# Patient Record
Sex: Male | Born: 1975 | Race: White | Hispanic: No | Marital: Married | Smoking: Never smoker
Health system: Southern US, Community
[De-identification: ages and names within clinical notes are randomized; demographics above are authoritative.]

## PROBLEM LIST (undated history)

## (undated) DIAGNOSIS — Z8619 Personal history of other infectious and parasitic diseases: Secondary | ICD-10-CM

## (undated) DIAGNOSIS — C4491 Basal cell carcinoma of skin, unspecified: Secondary | ICD-10-CM

## (undated) DIAGNOSIS — K429 Umbilical hernia without obstruction or gangrene: Secondary | ICD-10-CM

## (undated) DIAGNOSIS — Z8781 Personal history of (healed) traumatic fracture: Secondary | ICD-10-CM

## (undated) HISTORY — PX: HERNIA REPAIR: SHX51

## (undated) HISTORY — PX: BASAL CELL CARCINOMA EXCISION: SHX1214

## (undated) HISTORY — PX: NOSE SURGERY: SHX723

---

## 2007-05-01 ENCOUNTER — Ambulatory Visit: Payer: Self-pay | Admitting: Family Medicine

## 2013-11-01 LAB — BASIC METABOLIC PANEL
BUN: 14 mg/dL (ref 4–21)
CREATININE: 1 mg/dL (ref 0.6–1.3)
Glucose: 98 mg/dL
POTASSIUM: 4.7 mmol/L (ref 3.4–5.3)
SODIUM: 140 mmol/L (ref 137–147)

## 2013-11-01 LAB — HEPATIC FUNCTION PANEL
ALK PHOS: 73 U/L (ref 25–125)
ALT: 15 U/L (ref 10–40)
AST: 22 U/L (ref 14–40)
Bilirubin, Total: 0.4 mg/dL

## 2013-11-01 LAB — CBC AND DIFFERENTIAL
HCT: 46 % (ref 41–53)
HEMOGLOBIN: 15.7 g/dL (ref 13.5–17.5)
Neutrophils Absolute: 6 /uL
Platelets: 171 10*3/uL (ref 150–399)
WBC: 8.9 10^3/mL

## 2013-11-01 LAB — TSH: TSH: 2 u[IU]/mL (ref 0.41–5.90)

## 2015-09-17 DIAGNOSIS — Z8619 Personal history of other infectious and parasitic diseases: Secondary | ICD-10-CM | POA: Insufficient documentation

## 2016-05-07 ENCOUNTER — Encounter: Payer: Self-pay | Admitting: Family Medicine

## 2016-11-30 DIAGNOSIS — Z85828 Personal history of other malignant neoplasm of skin: Secondary | ICD-10-CM | POA: Diagnosis not present

## 2016-11-30 DIAGNOSIS — D485 Neoplasm of uncertain behavior of skin: Secondary | ICD-10-CM | POA: Diagnosis not present

## 2016-11-30 DIAGNOSIS — C4441 Basal cell carcinoma of skin of scalp and neck: Secondary | ICD-10-CM | POA: Diagnosis not present

## 2017-01-04 ENCOUNTER — Encounter: Payer: Self-pay | Admitting: Family Medicine

## 2017-01-04 ENCOUNTER — Ambulatory Visit (INDEPENDENT_AMBULATORY_CARE_PROVIDER_SITE_OTHER): Payer: 59 | Admitting: Family Medicine

## 2017-01-04 VITALS — BP 118/72 | Temp 97.9°F | Resp 16 | Ht 71.0 in | Wt 183.0 lb

## 2017-01-04 DIAGNOSIS — Z23 Encounter for immunization: Secondary | ICD-10-CM

## 2017-01-04 DIAGNOSIS — Z0000 Encounter for general adult medical examination without abnormal findings: Secondary | ICD-10-CM | POA: Diagnosis not present

## 2017-01-04 NOTE — Progress Notes (Addendum)
Patient: Frank Gray, Male    DOB: 03/28/1976, 41 y.o.   MRN: [id number] Visit Date: 01/04/2017  Today's Provider: Lelon Huh, MD   Chief Complaint  Patient presents with  . Annual Exam   Subjective:   Patient presents today to re-establish Care. Was last seen for CPE in August 2015. Reviewed entire medical, social, and family history today.     Annual physical exam Frank Gray is a 41 y.o. male who presents today for health maintenance and complete physical. He feels well. He reports exercising every other day. He reports he is sleeping well.  Tdap- 04/23/2010.   Review of Systems  Constitutional: Negative.   HENT: Negative.   Eyes: Negative.   Respiratory: Negative.   Cardiovascular: Negative.   Gastrointestinal: Negative.   Endocrine: Negative.   Genitourinary: Negative.   Musculoskeletal: Negative.   Skin: Negative.   Allergic/Immunologic: Negative.   Neurological: Negative.   Hematological: Negative.   Psychiatric/Behavioral: Negative.     Social History      He  reports that he has never smoked. He has never used smokeless tobacco. He reports that he does not use drugs.       Social History   Social History  . Marital status: Married    Spouse name: N/A  . Number of children: 4  . Years of education: N/A   Social History Main Topics  . Smoking status: Never Smoker  . Smokeless tobacco: Never Used  . Alcohol use 3.0 - 3.6 oz/week    5 - 6 Cans of beer per week  . Drug use: No  . Sexual activity: Not Asked   Other Topics Concern  . None   Social History Narrative  . None    No past medical history on file.   Patient Active Problem List   Diagnosis Date Noted  . History of chicken pox 09/17/2015    Past Surgical History:  Procedure Laterality Date  . BASAL CELL CARCINOMA EXCISION      Family History        Family Status  Relation Status  . Mother Alive  . Father Alive  . Brother Alive        His family  history is not on file.     No Known Allergies  No current outpatient prescriptions on file.   Patient Care Team: Birdie Sons, MD as PCP - General (Family Medicine)      Objective:   Vitals: BP 118/72 (BP Location: Left Arm, Patient Position: Sitting, Cuff Size: Normal)   Temp 97.9 F (36.6 C)   Resp 16   Ht 5\' 11"  (1.803 m)   Wt 183 lb (83 kg)   BMI 25.52 kg/m    Vitals:   01/04/17 0919  BP: 118/72  Resp: 16  Temp: 97.9 F (36.6 C)  Weight: 183 lb (83 kg)  Height: 5\' 11"  (1.803 m)     Physical Exam   General Appearance:    Alert, cooperative, no distress, appears stated age  Head:    Normocephalic, without obvious abnormality, atraumatic  Eyes:    PERRL, conjunctiva/corneas clear, EOM's intact, fundi    benign, both eyes       Ears:    Normal TM's and external ear canals, both ears  Nose:   Nares normal, septum midline, mucosa normal, no drainage   or sinus tenderness  Throat:   Lips, mucosa, and tongue normal; teeth and gums normal  Neck:   Supple, symmetrical, trachea midline, no adenopathy;       thyroid:  No enlargement/tenderness/nodules; no carotid   bruit or JVD  Back:     Symmetric, no curvature, ROM normal, no CVA tenderness  Lungs:     Clear to auscultation bilaterally, respirations unlabored  Chest wall:    No tenderness or deformity  Heart:    Regular rate and rhythm, S1 and S2 normal, no murmur, rub   or gallop  Abdomen:     Soft, non-tender, bowel sounds active all four quadrants,    no masses, no organomegaly. Small umbilical hernia noted  Genitalia:    deferred  Rectal:    deferred  Extremities:   Extremities normal, atraumatic, no cyanosis or edema  Pulses:   2+ and symmetric all extremities  Skin:   Skin color, texture, turgor normal, no rashes or lesions  Lymph nodes:   Cervical, supraclavicular, and axillary nodes normal  Neurologic:   CNII-XII intact. Normal strength, sensation and reflexes      throughout    Depression  Screen PHQ 2/9 Scores 01/04/2017  PHQ - 2 Score 0      Assessment & Plan:     Routine Health Maintenance and Physical Exam  Exercise Activities and Dietary recommendations Goals    None      Immunization History  Administered Date(s) Administered  . Tdap 04/23/2010    Health Maintenance  Topic Date Due  . INFLUENZA VACCINE  11/03/2016  . HIV Screening  01/04/2018 (Originally 07/22/1990)  . TETANUS/TDAP  04/23/2020     Discussed health benefits of physical activity, and encouraged him to engage in regular exercise appropriate for his age and condition.    1. Annual physical exam Generally doing well. No complaints today. Normal exam. Healthy lifestyle habits.  - COMPLETE METABOLIC PANEL WITH GFR - Lipid panel  2. Influenza vaccine needed  - Flu Vaccine QUAD 6+ mos PF IM (Fluarix Quad PF)     Lelon Huh, MD  [facility]

## 2017-01-06 LAB — COMPLETE METABOLIC PANEL WITH GFR
AG RATIO: 1.6 (calc) (ref 1.0–2.5)
ALBUMIN MSPROF: 4.2 g/dL (ref 3.6–5.1)
ALT: 14 U/L (ref 9–46)
AST: 18 U/L (ref 10–40)
Alkaline phosphatase (APISO): 78 U/L (ref 40–115)
BILIRUBIN TOTAL: 0.4 mg/dL (ref 0.2–1.2)
BUN: 14 mg/dL (ref 7–25)
CHLORIDE: 104 mmol/L (ref 98–110)
CO2: 30 mmol/L (ref 20–32)
Calcium: 9 mg/dL (ref 8.6–10.3)
Creat: 1.08 mg/dL (ref 0.60–1.35)
GFR, EST AFRICAN AMERICAN: 98 mL/min/{1.73_m2} (ref >=60)
GFR, Est Non African American: 85 mL/min/{1.73_m2} (ref >=60)
GLOBULIN: 2.7 g/dL (ref 1.9–3.7)
Glucose, Bld: 110 mg/dL — ABNORMAL HIGH (ref 65–99)
POTASSIUM: 4.4 mmol/L (ref 3.5–5.3)
Sodium: 140 mmol/L (ref 135–146)
TOTAL PROTEIN: 6.9 g/dL (ref 6.1–8.1)

## 2017-01-06 LAB — LIPID PANEL
Cholesterol: 147 mg/dL (ref <200)
HDL: 58 mg/dL (ref >40)
LDL Cholesterol (Calc): 75 mg/dL (calc)
NON-HDL CHOLESTEROL (CALC): 89 mg/dL (ref <130)
Total CHOL/HDL Ratio: 2.5 (calc) (ref <5.0)
Triglycerides: 49 mg/dL (ref <150)

## 2017-02-28 DIAGNOSIS — C4441 Basal cell carcinoma of skin of scalp and neck: Secondary | ICD-10-CM | POA: Diagnosis not present

## 2017-02-28 DIAGNOSIS — L905 Scar conditions and fibrosis of skin: Secondary | ICD-10-CM | POA: Diagnosis not present

## 2017-05-30 DIAGNOSIS — Z85828 Personal history of other malignant neoplasm of skin: Secondary | ICD-10-CM | POA: Diagnosis not present

## 2017-05-30 DIAGNOSIS — L905 Scar conditions and fibrosis of skin: Secondary | ICD-10-CM | POA: Diagnosis not present

## 2017-05-30 DIAGNOSIS — D485 Neoplasm of uncertain behavior of skin: Secondary | ICD-10-CM | POA: Diagnosis not present

## 2017-05-30 DIAGNOSIS — C44319 Basal cell carcinoma of skin of other parts of face: Secondary | ICD-10-CM | POA: Diagnosis not present

## 2017-07-06 DIAGNOSIS — L988 Other specified disorders of the skin and subcutaneous tissue: Secondary | ICD-10-CM | POA: Diagnosis not present

## 2017-07-06 DIAGNOSIS — L578 Other skin changes due to chronic exposure to nonionizing radiation: Secondary | ICD-10-CM | POA: Diagnosis not present

## 2017-07-06 DIAGNOSIS — C44319 Basal cell carcinoma of skin of other parts of face: Secondary | ICD-10-CM | POA: Diagnosis not present

## 2018-02-21 DIAGNOSIS — D2261 Melanocytic nevi of right upper limb, including shoulder: Secondary | ICD-10-CM | POA: Diagnosis not present

## 2018-02-21 DIAGNOSIS — D2262 Melanocytic nevi of left upper limb, including shoulder: Secondary | ICD-10-CM | POA: Diagnosis not present

## 2018-02-21 DIAGNOSIS — Z85828 Personal history of other malignant neoplasm of skin: Secondary | ICD-10-CM | POA: Diagnosis not present

## 2018-02-27 DIAGNOSIS — Z23 Encounter for immunization: Secondary | ICD-10-CM | POA: Diagnosis not present

## 2018-04-17 ENCOUNTER — Ambulatory Visit
Admission: RE | Admit: 2018-04-17 | Discharge: 2018-04-17 | Disposition: A | Discharge status: Home / Self Care | Payer: 59 | Source: Ambulatory Visit | Attending: Family Medicine | Admitting: Family Medicine

## 2018-04-17 ENCOUNTER — Telehealth: Payer: Self-pay | Admitting: Family Medicine

## 2018-04-17 ENCOUNTER — Ambulatory Visit: Payer: 59 | Admitting: Family Medicine

## 2018-04-17 ENCOUNTER — Ambulatory Visit
Admission: RE | Admit: 2018-04-17 | Discharge: 2018-04-17 | Disposition: A | Discharge status: Home / Self Care | Payer: 59 | Attending: Family Medicine | Admitting: Family Medicine

## 2018-04-17 ENCOUNTER — Encounter: Payer: Self-pay | Admitting: Family Medicine

## 2018-04-17 ENCOUNTER — Telehealth: Payer: Self-pay

## 2018-04-17 VITALS — BP 100/60 | HR 76 | Temp 97.7°F | Resp 16 | Wt 198.4 lb

## 2018-04-17 DIAGNOSIS — R0781 Pleurodynia: Secondary | ICD-10-CM | POA: Insufficient documentation

## 2018-04-17 DIAGNOSIS — M546 Pain in thoracic spine: Secondary | ICD-10-CM | POA: Insufficient documentation

## 2018-04-17 NOTE — Telephone Encounter (Signed)
Patient is returning a call to [facility]

## 2018-04-17 NOTE — Progress Notes (Signed)
Patient: Frank Gray Male    DOB: 30-Jun-1975   43 y.o.   MRN: [id number] Visit Date: 04/17/2018  Today's Provider: Vernie Murders, PA   Chief Complaint  Patient presents with  . Back Pain   Subjective:     Back Pain  This is a new problem. The current episode started in the past 7 days (Wednesday afternoon). The problem occurs constantly. The problem has been gradually worsening since onset. Pain location: Mid section of back. Quality: Dull and Throbbing. Radiates to: radiate to the front of his ribs. The pain is at a severity of 7/10. The pain is moderate. The pain is the same all the time. The symptoms are aggravated by position (movement). Pertinent negatives include no numbness, tingling or weakness. He has tried bed rest for the symptoms.  Patient reports that he has this persistent cough in the mornings for the past months.  History reviewed. No pertinent past medical history. Past Surgical History:  Procedure Laterality Date  . BASAL CELL CARCINOMA EXCISION     Family History  Problem Relation Age of Onset  . Dementia Mother    No Known Allergies  No current outpatient medications on file.  Review of Systems  Musculoskeletal: Positive for back pain.  Neurological: Negative for tingling, weakness and numbness.   Social History   Tobacco Use  . Smoking status: Never Smoker  . Smokeless tobacco: Never Used  Substance Use Topics  . Alcohol use: Yes    Alcohol/week: 5.0 - 6.0 standard drinks    Types: 5 - 6 Cans of beer per week     Objective:   BP 100/60 (BP Location: Right Arm, Patient Position: Sitting, Cuff Size: Large)   Pulse 76   Temp 97.7 F (36.5 C) (Oral)   Resp 16   Wt 198 lb 6.4 oz (90 kg)   SpO2 97%   BMI 27.67 kg/m  Vitals:   04/17/18 0830  BP: 100/60  Pulse: 76  Resp: 16  Temp: 97.7 F (36.5 C)  TempSrc: Oral  SpO2: 97%  Weight: 198 lb 6.4 oz (90 kg)   Physical Exam Constitutional:      General: He is not in acute  distress.    Appearance: He is well-developed.  HENT:     Head: Normocephalic and atraumatic.     Right Ear: Hearing and tympanic membrane normal.     Left Ear: Hearing and tympanic membrane normal.     Nose: Nose normal.     Mouth/Throat:     Pharynx: Oropharynx is clear.  Eyes:     General: Lids are normal. No scleral icterus.       Right eye: No discharge.        Left eye: No discharge.     Conjunctiva/sclera: Conjunctivae normal.  Cardiovascular:     Rate and Rhythm: Normal rate and regular rhythm.     Pulses: Normal pulses.     Heart sounds: Normal heart sounds.  Pulmonary:     Effort: Pulmonary effort is normal. No respiratory distress.     Breath sounds: Normal breath sounds. No stridor. No wheezing, rhonchi or rales.  Chest:     Chest wall: No tenderness.  Abdominal:     General: Bowel sounds are normal.     Palpations: Abdomen is soft.  Musculoskeletal: Normal range of motion.  Skin:    Findings: No lesion or rash.  Neurological:     Mental Status: He is alert  and oriented to person, place, and time.  Psychiatric:        Speech: Speech normal.        Behavior: Behavior normal.        Thought Content: Thought content normal.       Assessment & Plan    1. Rib pain Onset 04-12-18 and continues to be a dull pain with movement. At rest no pain. No known injury or significant congestion Has had some cough recently. May use Aleve BID and get labs with CXR to rule out pulmonary issues. - CBC with Differential/Platelet - Comprehensive metabolic panel - DG Chest 2 View  2. Bilateral thoracic back pain, unspecified chronicity Bilateral posterior thoracic pain to bend over without rash or tenderness to palpation. Onset with rib pains with movement. Will check CXR to rule out pneumonia or pleural effusions. Also, check labs. May use Aleve BID prn. Recheck pending reports. - CBC with Differential/Platelet - Comprehensive metabolic panel - DG Chest 2 View     Vernie Murders, [place]  [facility]

## 2018-04-17 NOTE — Telephone Encounter (Signed)
-----   Message from Margo Common, [place] sent at 04/17/2018 10:04 AM EST ----- Chest x-ray normal without signs of cardiopulmonary or skeletal disease. Awaiting final lab reports.

## 2018-04-17 NOTE — Telephone Encounter (Signed)
Please see other telephone encounter.

## 2018-04-17 NOTE — Telephone Encounter (Signed)
Patient advised as directed below. 

## 2018-04-17 NOTE — Telephone Encounter (Signed)
LMTCB

## 2018-04-18 ENCOUNTER — Telehealth: Payer: Self-pay

## 2018-04-18 LAB — CBC WITH DIFFERENTIAL/PLATELET
BASOS: 1 %
Basophils Absolute: 0 10*3/uL (ref 0.0–0.2)
EOS (ABSOLUTE): 0.1 10*3/uL (ref 0.0–0.4)
Eos: 1 %
Hematocrit: 46.4 % (ref 37.5–51.0)
Hemoglobin: 16.1 g/dL (ref 13.0–17.7)
IMMATURE GRANS (ABS): 0 10*3/uL (ref 0.0–0.1)
Immature Granulocytes: 0 %
Lymphocytes Absolute: 2.3 10*3/uL (ref 0.7–3.1)
Lymphs: 28 %
MCH: 30.1 pg (ref 26.6–33.0)
MCHC: 34.7 g/dL (ref 31.5–35.7)
MCV: 87 fL (ref 79–97)
MONOS ABS: 0.8 10*3/uL (ref 0.1–0.9)
Monocytes: 9 %
NEUTROS ABS: 5.1 10*3/uL (ref 1.4–7.0)
Neutrophils: 61 %
PLATELETS: 190 10*3/uL (ref 150–450)
RBC: 5.34 x10E6/uL (ref 4.14–5.80)
RDW: 11.9 % (ref 11.6–15.4)
WBC: 8.3 10*3/uL (ref 3.4–10.8)

## 2018-04-18 LAB — COMPREHENSIVE METABOLIC PANEL
ALBUMIN: 4.8 g/dL (ref 3.5–5.5)
ALK PHOS: 86 IU/L (ref 39–117)
ALT: 18 IU/L (ref 0–44)
AST: 21 IU/L (ref 0–40)
Albumin/Globulin Ratio: 1.9 (ref 1.2–2.2)
BUN/Creatinine Ratio: 11 (ref 9–20)
BUN: 11 mg/dL (ref 6–24)
Bilirubin Total: 0.4 mg/dL (ref 0.0–1.2)
CO2: 24 mmol/L (ref 20–29)
CREATININE: 1.02 mg/dL (ref 0.76–1.27)
Calcium: 9.3 mg/dL (ref 8.7–10.2)
Chloride: 102 mmol/L (ref 96–106)
GFR calc Af Amer: 104 mL/min/{1.73_m2} (ref >59)
GFR calc non Af Amer: 90 mL/min/{1.73_m2} (ref >59)
GLUCOSE: 93 mg/dL (ref 65–99)
Globulin, Total: 2.5 g/dL (ref 1.5–4.5)
Potassium: 4.4 mmol/L (ref 3.5–5.2)
Sodium: 143 mmol/L (ref 134–144)
Total Protein: 7.3 g/dL (ref 6.0–8.5)

## 2018-04-18 NOTE — Telephone Encounter (Signed)
-----   Message from Margo Common, [place] sent at 04/18/2018  1:51 PM EST ----- Normal x-ray and labs. No sign of infection. Suspect rib discomfort secondary to muscle strain/inflammation. Use Aleve BID and moist heat applications to clear this discomfort. Recheck if any worsening or congestion issues in the next 7-10 days.

## 2018-04-18 NOTE — Telephone Encounter (Signed)
Patient advised as directed below.  Thanks,  -Joseline 

## 2019-05-16 IMAGING — CR DG CHEST 2V
1 series · 3 of 3 positions shown · non-contrast
Comparison: Radiographs May 01, 2007.

CLINICAL DATA: Acute bilateral rib pain without known injury.

EXAM:
CHEST - 2 VIEW

[Series 1: dg chest 2 view · 0.14mm/px · 3 of 3 slices shown]
[im 1/3]
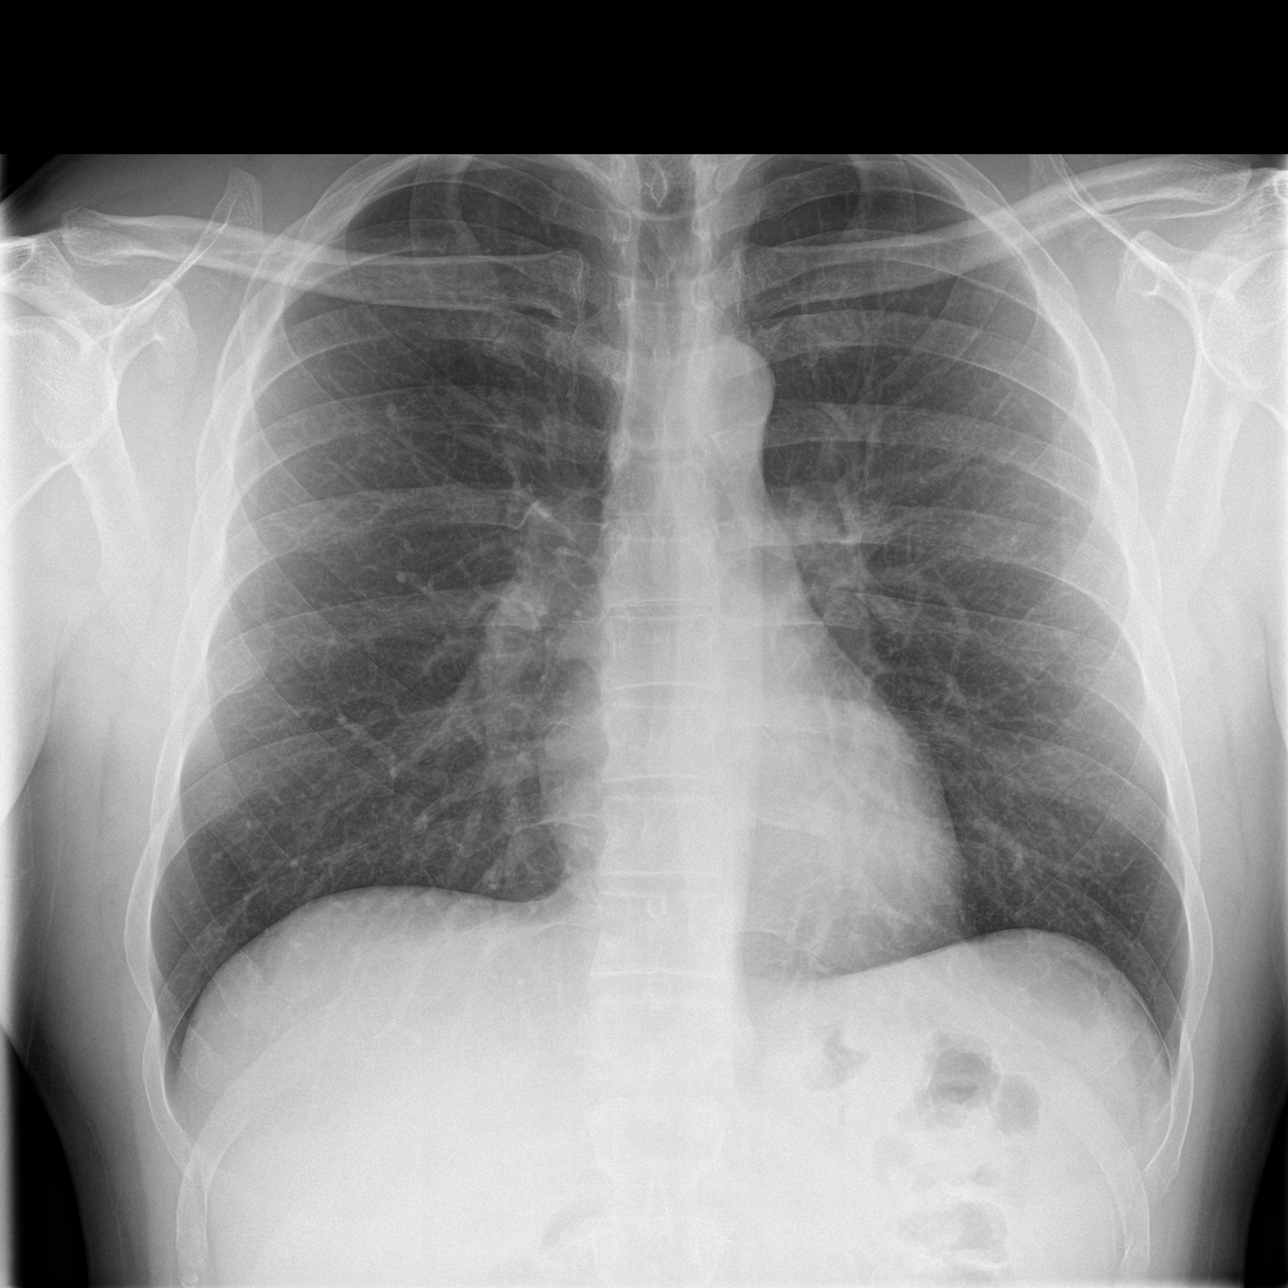
[im 2/3]
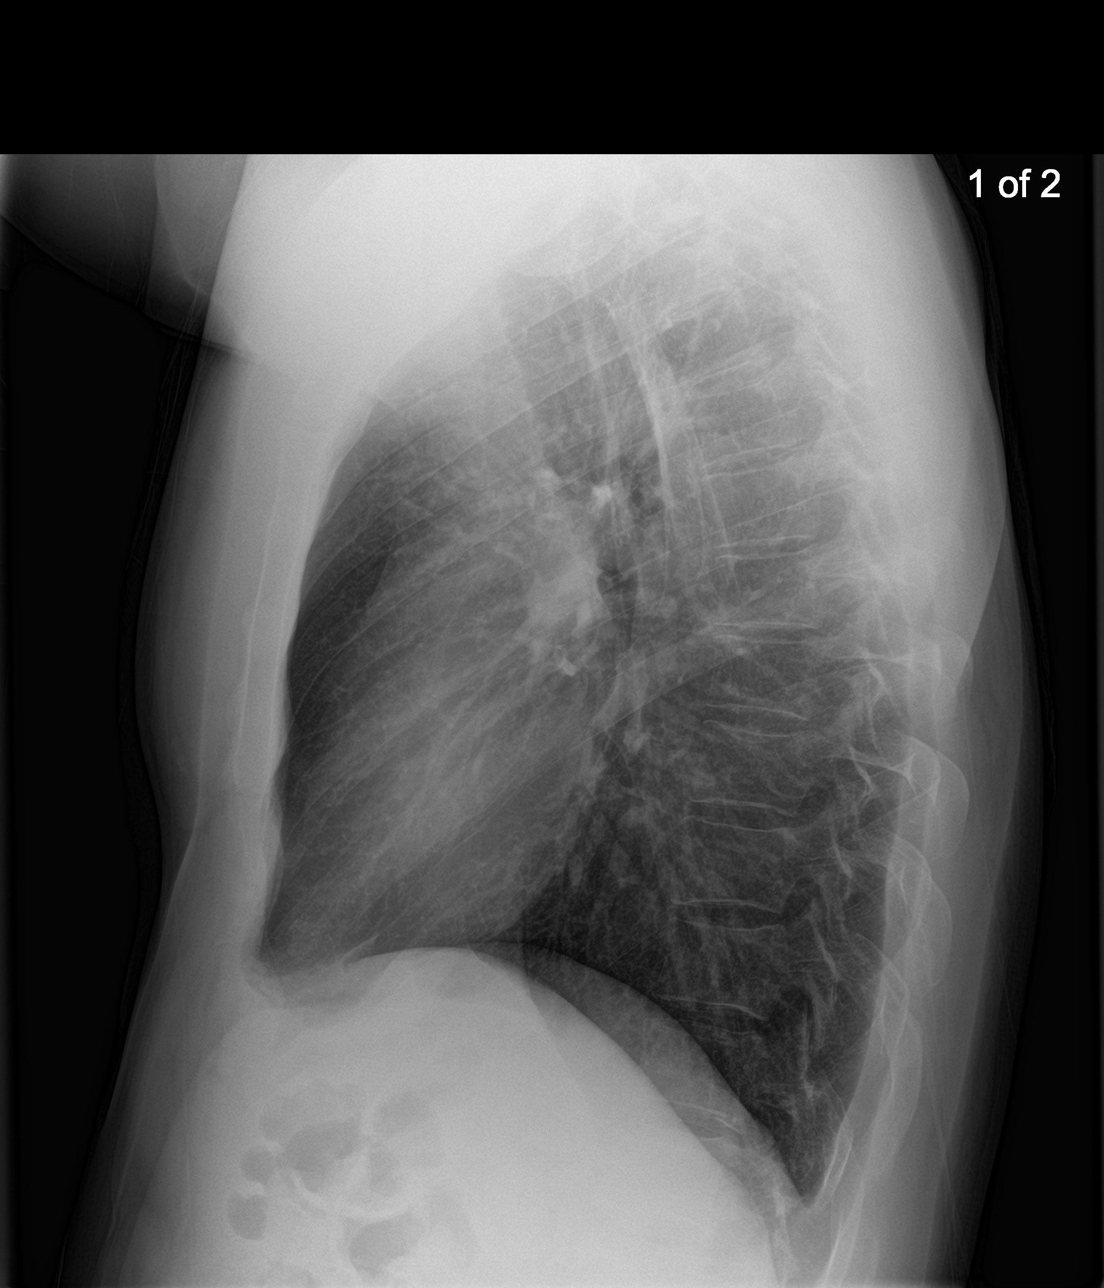
[im 3/3]
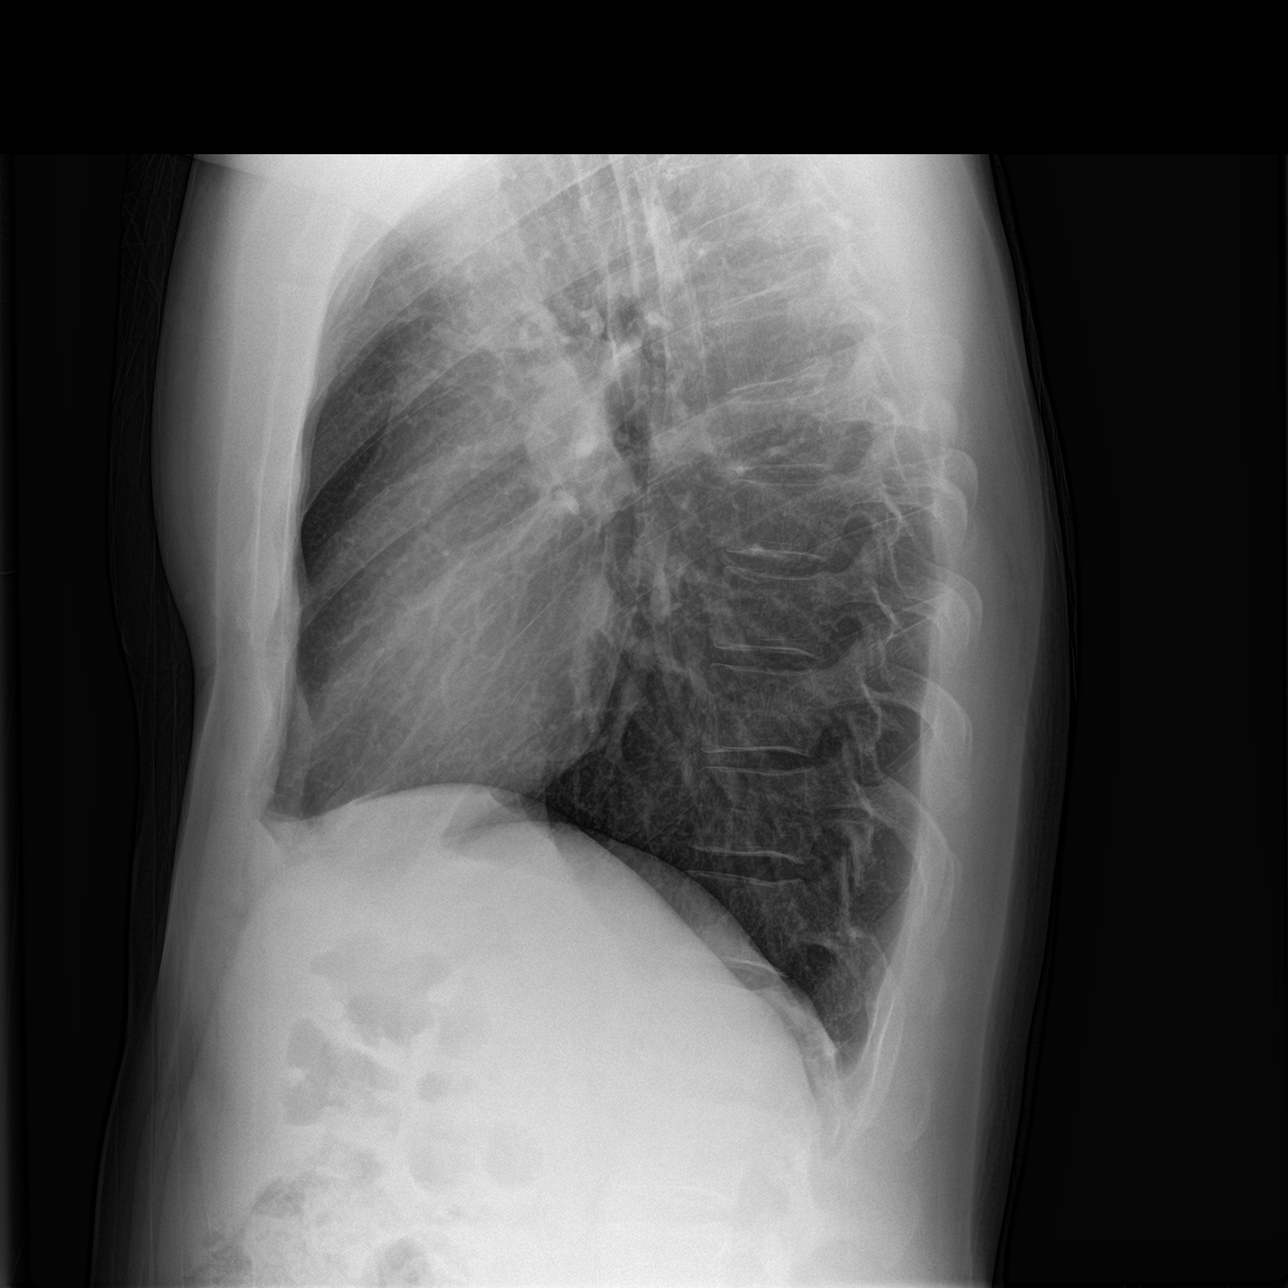

[3 of 3 positions shown; findings below may reference images not displayed]

FINDINGS: The heart size and mediastinal contours are within normal limits.
Both lungs are clear. The visualized skeletal structures are
unremarkable.
IMPRESSION: No active cardiopulmonary disease.

## 2020-05-20 ENCOUNTER — Other Ambulatory Visit: Payer: Self-pay

## 2020-05-20 ENCOUNTER — Encounter: Payer: Self-pay | Admitting: Family Medicine

## 2020-05-20 ENCOUNTER — Ambulatory Visit (INDEPENDENT_AMBULATORY_CARE_PROVIDER_SITE_OTHER): Payer: 59 | Admitting: Family Medicine

## 2020-05-20 VITALS — BP 115/76 | HR 74 | Temp 97.9°F | Resp 16 | Ht 71.5 in | Wt 198.0 lb

## 2020-05-20 DIAGNOSIS — K429 Umbilical hernia without obstruction or gangrene: Secondary | ICD-10-CM | POA: Insufficient documentation

## 2020-05-20 DIAGNOSIS — Z1159 Encounter for screening for other viral diseases: Secondary | ICD-10-CM

## 2020-05-20 DIAGNOSIS — Z0000 Encounter for general adult medical examination without abnormal findings: Secondary | ICD-10-CM | POA: Diagnosis not present

## 2020-05-20 DIAGNOSIS — Z23 Encounter for immunization: Secondary | ICD-10-CM | POA: Diagnosis not present

## 2020-05-20 NOTE — Patient Instructions (Addendum)
.   It is recommended to engage in 150 minutes of moderate exercise every week.     The current recommendation is to start colon cancer screening with a colonoscopy or Cologuard test at age 45. I suggest checking with insurance to see if this screening test is covered.

## 2020-05-20 NOTE — Progress Notes (Signed)
Complete physical exam   Patient: Frank Gray   DOB: Aug 22, 1975   45 y.o. Male  MRN: [id number] Visit Date: 05/20/2020  Today's healthcare provider: Lelon Huh, MD   Chief Complaint  Patient presents with  . Annual Exam   Subjective    Frank Gray is a 45 y.o. male who presents today for a complete physical exam.  He reports consuming a general diet. Gym/ health club routine includes cardio and weight lifting. He generally feels fairly well. He reports sleeping fairly well. He does not have additional problems to discuss today.  HPI    No past medical history on file. Past Surgical History:  Procedure Laterality Date  . BASAL CELL CARCINOMA EXCISION     Social History   Socioeconomic History  . Marital status: Married    Spouse name: Not on file  . Number of children: 4  . Years of education: Not on file  . Highest education level: Not on file  Occupational History  . Not on file  Tobacco Use  . Smoking status: Never Smoker  . Smokeless tobacco: Never Used  Substance and Sexual Activity  . Alcohol use: Yes    Alcohol/week: 5.0 - 6.0 standard drinks    Types: 5 - 6 Cans of beer per week  . Drug use: No  . Sexual activity: Not on file  Other Topics Concern  . Not on file  Social History Narrative  . Not on file   Social Determinants of Health   Financial Resource Strain: Not on file  Food Insecurity: Not on file  Transportation Needs: Not on file  Physical Activity: Not on file  Stress: Not on file  Social Connections: Not on file  Intimate Partner Violence: Not on file   Family Status  Relation Name Status  . Mother  Alive  . Father  Alive  . Brother  Alive   Family History  Problem Relation Age of Onset  . Dementia Mother    No Known Allergies  Patient Care Team: Birdie Sons, MD as PCP - General (Family Medicine)   Medications: No outpatient medications prior to visit.   No facility-administered medications prior to  visit.    Review of Systems  Constitutional: Negative for appetite change, chills, fatigue and fever.  HENT: Negative for congestion, ear pain, hearing loss, nosebleeds and trouble swallowing.   Eyes: Negative for pain and visual disturbance.  Respiratory: Negative for cough, chest tightness and shortness of breath.   Cardiovascular: Negative for chest pain, palpitations and leg swelling.  Gastrointestinal: Negative for abdominal pain, blood in stool, constipation, diarrhea, nausea and vomiting.  Endocrine: Negative for polydipsia, polyphagia and polyuria.  Genitourinary: Negative for dysuria and flank pain.  Musculoskeletal: Negative for arthralgias, back pain, joint swelling, myalgias and neck stiffness.  Skin: Negative for color change, rash and wound.  Neurological: Negative for dizziness, tremors, seizures, speech difficulty, weakness, light-headedness and headaches.  Psychiatric/Behavioral: Negative for behavioral problems, confusion, decreased concentration, dysphoric mood and sleep disturbance. The patient is not nervous/anxious.   All other systems reviewed and are negative.     Objective    BP 115/76 (BP Location: Left Arm, Patient Position: Sitting, Cuff Size: Large)   Pulse 74   Temp 97.9 F (36.6 C) (Temporal)   Resp 16   Ht 5' 11.5" (1.816 m)   Wt 198 lb (89.8 kg)   BMI 27.23 kg/m    Physical Exam   General Appearance:  Overweight male. Alert, cooperative, in no acute distress, appears stated age  Head:    Normocephalic, without obvious abnormality, atraumatic  Eyes:    PERRL, conjunctiva/corneas clear, EOM's intact, fundi    benign, both eyes       Ears:    Normal TM's and external ear canals, both ears  Neck:   Supple, symmetrical, trachea midline, no adenopathy;       thyroid:  No enlargement/tenderness/nodules; no carotid   bruit or JVD  Back:     Symmetric, no curvature, ROM normal, no CVA tenderness  Lungs:     Clear to auscultation bilaterally,  respirations unlabored  Chest wall:    No tenderness or deformity  Heart:    Normal heart rate. Normal rhythm. No murmurs, rubs, or gallops.  S1 and S2 normal  Abdomen:     Soft, non-tender, bowel sounds active all four quadrants,    no masses, no organomegaly. Small umbilical hernia.   Genitalia:    deferred  Rectal:    deferred  Extremities:   All extremities are intact. No cyanosis or edema  Pulses:   2+ and symmetric all extremities  Skin:   Skin color, texture, turgor normal, no rashes or lesions  Lymph nodes:   Cervical, supraclavicular, and axillary nodes normal  Neurologic:   CNII-XII intact. Normal strength, sensation and reflexes      throughout    Last depression screening scores PHQ 2/9 Scores 05/20/2020 04/17/2018 01/04/2017  PHQ - 2 Score 0 0 0  PHQ- 9 Score 0 - -   Last fall risk screening Fall Risk  05/20/2020  Falls in the past year? 0  Number falls in past yr: 0  Injury with Fall? 0  Follow up Falls evaluation completed   Last Audit-C alcohol use screening Alcohol Use Disorder Test (AUDIT) 05/20/2020  1. How often do you have a drink containing alcohol? 3  2. How many drinks containing alcohol do you have on a typical day when you are drinking? 0  3. How often do you have six or more drinks on one occasion? 1  AUDIT-C Score 4  4. How often during the last year have you found that you were not able to stop drinking once you had started? 0  5. How often during the last year have you failed to do what was normally expected from you because of drinking? 0  6. How often during the last year have you needed a first drink in the morning to get yourself going after a heavy drinking session? 0  7. How often during the last year have you had a feeling of guilt of remorse after drinking? 0  8. How often during the last year have you been unable to remember what happened the night before because you had been drinking? 0  9. Have you or someone else been injured as a result of  your drinking? 0  10. Has a relative or friend or a doctor or another health worker been concerned about your drinking or suggested you cut down? 0  Alcohol Use Disorder Identification Test Final Score (AUDIT) 4  Alcohol Brief Interventions/Follow-up AUDIT Score <7 follow-up not indicated   A score of 3 or more in women, and 4 or more in men indicates increased risk for alcohol abuse, EXCEPT if all of the points are from question 1   No results found for any visits on 05/20/20.  Assessment & Plan    Routine Health Maintenance and Physical  Exam  Exercise Activities and Dietary recommendations Goals   None     Immunization History  Administered Date(s) Administered  . Influenza,inj,Quad PF,6+ Mos 01/04/2017  . Tdap 04/23/2010    Health Maintenance  Topic Date Due  . Hepatitis C Screening  Never done  . COVID-19 Vaccine (1) Never done  . HIV Screening  Never done  . TETANUS/TDAP  04/23/2020  . INFLUENZA VACCINE  07/03/2020 (Originally 11/04/2019)    Discussed health benefits of physical activity, and encouraged him to engage in regular exercise appropriate for his age and condition.  1. Annual physical exam Doing well, counseled on ideal weight. Counseled on current recommendations for colon cancer screening. He declined flu vaccine today.  - CBC - Comprehensive metabolic panel - Lipid panel  2. Need for tetanus, diphtheria, and acellular pertussis (Tdap) vaccine in patient of adolescent age or older  - Administer Tetanus-diphtheria-acellular pertussis (Tdap) vaccine  3. Need for hepatitis C screening test  - Hepatitis C antibody   No follow-ups on file.     The entirety of the information documented in the History of Present Illness, Review of Systems and Physical Exam were personally obtained by me. Portions of this information were initially documented by the CMA and reviewed by me for thoroughness and accuracy.      Lelon Huh, MD  [facility] [phone] (phone) [phone] (fax)  [place]

## 2020-05-21 LAB — COMPREHENSIVE METABOLIC PANEL
ALT: 17 IU/L (ref 0–44)
AST: 19 IU/L (ref 0–40)
Albumin/Globulin Ratio: 1.7 (ref 1.2–2.2)
Albumin: 4.5 g/dL (ref 4.0–5.0)
Alkaline Phosphatase: 93 IU/L (ref 44–121)
BUN/Creatinine Ratio: 12 (ref 9–20)
BUN: 11 mg/dL (ref 6–24)
Bilirubin Total: 0.5 mg/dL (ref 0.0–1.2)
CO2: 25 mmol/L (ref 20–29)
Calcium: 9.7 mg/dL (ref 8.7–10.2)
Chloride: 100 mmol/L (ref 96–106)
Creatinine, Ser: 0.93 mg/dL (ref 0.76–1.27)
GFR calc Af Amer: 115 mL/min/{1.73_m2} (ref >59)
GFR calc non Af Amer: 99 mL/min/{1.73_m2} (ref >59)
Globulin, Total: 2.7 g/dL (ref 1.5–4.5)
Glucose: 107 mg/dL — ABNORMAL HIGH (ref 65–99)
Potassium: 4.3 mmol/L (ref 3.5–5.2)
Sodium: 140 mmol/L (ref 134–144)
Total Protein: 7.2 g/dL (ref 6.0–8.5)

## 2020-05-21 LAB — CBC
Hematocrit: 45.1 % (ref 37.5–51.0)
Hemoglobin: 15.3 g/dL (ref 13.0–17.7)
MCH: 29.8 pg (ref 26.6–33.0)
MCHC: 33.9 g/dL (ref 31.5–35.7)
MCV: 88 fL (ref 79–97)
Platelets: 201 10*3/uL (ref 150–450)
RBC: 5.14 x10E6/uL (ref 4.14–5.80)
RDW: 11.8 % (ref 11.6–15.4)
WBC: 8.9 10*3/uL (ref 3.4–10.8)

## 2020-05-21 LAB — LIPID PANEL
Chol/HDL Ratio: 3.1 ratio (ref 0.0–5.0)
Cholesterol, Total: 157 mg/dL (ref 100–199)
HDL: 50 mg/dL (ref >39)
LDL Chol Calc (NIH): 94 mg/dL (ref 0–99)
Triglycerides: 64 mg/dL (ref 0–149)
VLDL Cholesterol Cal: 13 mg/dL (ref 5–40)

## 2020-05-21 LAB — HEPATITIS C ANTIBODY: Hep C Virus Ab: <0.1 s/co ratio (ref 0.0–0.9)

## 2020-05-27 ENCOUNTER — Encounter: Payer: Self-pay | Admitting: Family Medicine

## 2020-05-27 ENCOUNTER — Telehealth (INDEPENDENT_AMBULATORY_CARE_PROVIDER_SITE_OTHER): Payer: 59 | Admitting: Family Medicine

## 2020-05-27 VITALS — Wt 193.0 lb

## 2020-05-27 DIAGNOSIS — J069 Acute upper respiratory infection, unspecified: Secondary | ICD-10-CM

## 2020-05-27 NOTE — Progress Notes (Signed)
MyChart Video Visit    Virtual Visit via Video Note   This visit type was conducted due to national recommendations for restrictions regarding the COVID-19 Pandemic (e.g. social distancing) in an effort to limit this patient's exposure and mitigate transmission in our community. This patient is at least at moderate risk for complications without adequate follow up. This format is felt to be most appropriate for this patient at this time. Physical exam was limited by quality of the video and audio technology used for the visit.    Patient location: home Provider location: [place] involved in the visit: patient, provider  I discussed the limitations of evaluation and management by telemedicine and the availability of in person appointments. The patient expressed understanding and agreed to proceed.  Patient: Frank Gray   DOB: [birth year]   45 y.o. Male  MRN: [id number] Visit Date: 05/27/2020  Today's healthcare provider: Lavon Paganini, MD   Chief Complaint  Patient presents with  . URI    Patient taking mucinex, reports mild symptom control.   Subjective    HPI HPI    URI    Associated symptoms inlclude congestion, cough, rhinorrhea, sinus pain and wheezing.  Recent episode started in the past 7 days.  The problem has been unchanged since onset.  The temperature has been with in normal range.  Patient  is drinking plenty of fluids.  Past hisotry is significant for  no history of pneumonia or bronchitis.  Patient is not a smoker. Additional comments: Patient taking mucinex, reports mild symptom control.       Last edited by Dorian Pod, [place] on 05/27/2020 11:23 AM. (History)      first day of symptoms 2/19 sinus congestion no headache, fever, sore throat covid vaccine x2 home test negative for COVID, had COVID 6 wks ago  Patient Active Problem List   Diagnosis Date Noted  . Umbilical hernia without obstruction and without  gangrene 05/20/2020  . History of chicken pox 09/17/2015   Social History   Tobacco Use  . Smoking status: Never Smoker  . Smokeless tobacco: Never Used  Substance Use Topics  . Alcohol use: Yes    Alcohol/week: 5.0 - 6.0 standard drinks    Types: 5 - 6 Cans of beer per week  . Drug use: No   No Known Allergies    Medications: No outpatient medications prior to visit.   No facility-administered medications prior to visit.    Review of Systems  Constitutional: Negative for chills, fatigue and fever.  HENT: Positive for congestion, rhinorrhea, sinus pressure and sinus pain. Negative for ear pain, postnasal drip and sore throat.   Respiratory: Positive for cough and wheezing. Negative for chest tightness and shortness of breath.   Cardiovascular: Negative for chest pain and palpitations.    Last CBC Lab Results  Component Value Date   WBC 8.9 05/20/2020   HGB 15.3 05/20/2020   HCT 45.1 05/20/2020   MCV 88 05/20/2020   MCH 29.8 05/20/2020   RDW 11.8 05/20/2020   PLT 201 05/20/2020      Objective    Wt 193 lb (87.5 kg)   BMI 26.54 kg/m  Wt Readings from Last 3 Encounters:  05/27/20 193 lb (87.5 kg)  05/20/20 198 lb (89.8 kg)  04/17/18 198 lb 6.4 oz (90 kg)      Physical Exam Constitutional:      General: He is not in acute distress.    Appearance: Normal  appearance.  Pulmonary:     Effort: Pulmonary effort is normal. No respiratory distress.  Neurological:     Mental Status: He is alert and oriented to person, place, and time.  Psychiatric:        Behavior: Behavior normal.        Assessment & Plan     1. Viral URI - symptoms and exam c/w viral URI - no evidence of strep pharyngitis, CAP, AOM, bacterial sinusitis, or other bacterial infection - unlikely COVID given recent infection and neg antigen test - would not repeat PCR as it could still be positive from recent infection - discussed symptomatic management (mucinex, honey, humidifier, etc),  natural course, and return precautions   Return if symptoms worsen or fail to improve.     I discussed the assessment and treatment plan with the patient. The patient was provided an opportunity to ask questions and all were answered. The patient agreed with the plan and demonstrated an understanding of the instructions.   The patient was advised to call back or seek an in-person evaluation if the symptoms worsen or if the condition fails to improve as anticipated.  I, Lavon Paganini, MD, have reviewed all documentation for this visit. The documentation on 05/27/20 for the exam, diagnosis, procedures, and orders are all accurate and complete.   Bacigalupo, Dionne Bucy, MD, MPH [facility]

## 2020-05-27 NOTE — Patient Instructions (Signed)

## 2020-06-05 ENCOUNTER — Telehealth (INDEPENDENT_AMBULATORY_CARE_PROVIDER_SITE_OTHER): Payer: 59 | Admitting: Physician Assistant

## 2020-06-05 DIAGNOSIS — J40 Bronchitis, not specified as acute or chronic: Secondary | ICD-10-CM

## 2020-06-05 DIAGNOSIS — R062 Wheezing: Secondary | ICD-10-CM | POA: Diagnosis not present

## 2020-06-05 DIAGNOSIS — J0110 Acute frontal sinusitis, unspecified: Secondary | ICD-10-CM

## 2020-06-05 MED ORDER — PREDNISONE 10 MG PO TABS: ORAL_TABLET | ORAL | 0 refills | Status: DC

## 2020-06-05 MED ORDER — AMOXICILLIN-POT CLAVULANATE 875-125 MG PO TABS: 1.0000 | ORAL_TABLET | Freq: Two times a day (BID) | ORAL | 0 refills | Status: AC

## 2020-06-05 NOTE — Patient Instructions (Signed)

## 2020-06-05 NOTE — Progress Notes (Signed)
MyChart Video Visit    Virtual Visit via Video Note   This visit type was conducted due to national recommendations for restrictions regarding the COVID-19 Pandemic (e.g. social distancing) in an effort to limit this patient's exposure and mitigate transmission in our community. This patient is at least at moderate risk for complications without adequate follow up. This format is felt to be most appropriate for this patient at this time. Physical exam was limited by quality of the video and audio technology used for the visit.   Patient location: Home Provider location: Office   I discussed the limitations of evaluation and management by telemedicine and the availability of in person appointments. The patient expressed understanding and agreed to proceed.  Patient: Frank Gray   DOB: 01-25-1976   45 y.o. Male  MRN: [id number] Visit Date: 06/05/2020  Today's healthcare provider: Trinna Post, PA-C   Chief Complaint  Patient presents with  . Cough  I,Porsha C McClurkin,acting as a scribe for Trinna Post, PA-C.,have documented all relevant documentation on the behalf of Trinna Post, PA-C,as directed by  Trinna Post, PA-C while in the presence of Trinna Post, PA-C.  Subjective    Cough This is a recurrent problem. The current episode started 1 to 4 weeks ago. The problem has been gradually worsening. The problem occurs constantly. The cough is productive of sputum. Associated symptoms include nasal congestion, shortness of breath and wheezing. Pertinent negatives include no chills, ear congestion, ear pain, headaches, postnasal drip, rhinorrhea or sore throat. Treatments tried: mucinex. The treatment provided mild relief. His past medical history is significant for bronchitis. There is no history of asthma, bronchiectasis, COPD, emphysema, environmental allergies or pneumonia.    Patient had URI several weeks ago with worsening nasal congestion since then.      Medications: No outpatient medications prior to visit.   No facility-administered medications prior to visit.    Review of Systems  Constitutional: Negative for chills.  HENT: Negative for ear pain, postnasal drip, rhinorrhea and sore throat.   Respiratory: Positive for cough, shortness of breath and wheezing.   Allergic/Immunologic: Negative for environmental allergies.  Neurological: Negative for headaches.      Objective    There were no vitals taken for this visit.   Physical Exam Constitutional:      Appearance: Normal appearance. He is obese.  Pulmonary:     Effort: Pulmonary effort is normal. No respiratory distress.  Skin:    General: Skin is warm and dry.  Neurological:     General: No focal deficit present.     Mental Status: He is alert and oriented to person, place, and time.  Psychiatric:        Mood and Affect: Mood normal.        Behavior: Behavior normal.        Assessment & Plan    1. Acute non-recurrent frontal sinusitis  - amoxicillin-clavulanate (AUGMENTIN) 875-125 MG tablet; Take 1 tablet by mouth 2 (two) times daily for 7 days.  Dispense: 14 tablet; Refill: 0  2. Wheezing  - predniSONE (DELTASONE) 10 MG tablet; Take 6 pills on day 1, 5 pills on day 2 and so on until complete.  Dispense: 21 tablet; Refill: 0  3. Bronchitis  - predniSONE (DELTASONE) 10 MG tablet; Take 6 pills on day 1, 5 pills on day 2 and so on until complete.  Dispense: 21 tablet; Refill: 0   No follow-ups on file.  I discussed the assessment and treatment plan with the patient. The patient was provided an opportunity to ask questions and all were answered. The patient agreed with the plan and demonstrated an understanding of the instructions.   The patient was advised to call back or seek an in-person evaluation if the symptoms worsen or if the condition fails to improve as anticipated.   ITrinna Post, PA-C, have reviewed all documentation for this  visit. The documentation on 06/05/20 for the exam, diagnosis, procedures, and orders are all accurate and complete.  The entirety of the information documented in the History of Present Illness, Review of Systems and Physical Exam were personally obtained by me. Portions of this information were initially documented by [facility] and reviewed by me for thoroughness and accuracy.    Paulene Floor [facility] [phone] (phone) [phone] (fax)  [place]

## 2020-10-28 ENCOUNTER — Other Ambulatory Visit: Payer: Self-pay

## 2020-10-28 ENCOUNTER — Encounter: Payer: Self-pay | Admitting: Family Medicine

## 2020-10-28 ENCOUNTER — Ambulatory Visit: Payer: 59 | Admitting: Family Medicine

## 2020-10-28 VITALS — BP 118/80 | HR 72 | Temp 98.2°F | Resp 16 | Wt 203.4 lb

## 2020-10-28 DIAGNOSIS — L723 Sebaceous cyst: Secondary | ICD-10-CM

## 2020-10-28 DIAGNOSIS — Z1211 Encounter for screening for malignant neoplasm of colon: Secondary | ICD-10-CM

## 2020-10-28 DIAGNOSIS — Z3009 Encounter for other general counseling and advice on contraception: Secondary | ICD-10-CM

## 2020-10-28 NOTE — Progress Notes (Signed)
I,Roshena L Chambers,acting as a scribe for Wilhemena Durie, MD.,have documented all relevant documentation on the behalf of Wilhemena Durie, MD,as directed by  Wilhemena Durie, MD while in the presence of Wilhemena Durie, MD.     Established patient visit   Patient: Frank Gray   DOB: [birth year]   45 y.o. Male  MRN: [id number] Visit Date: 10/28/2020  Today's healthcare provider: Wilhemena Durie, MD   Chief Complaint  Patient presents with   Mass   Subjective    HPI  Mass: Patient complains of a small mass on his pelvis. He first notice it 2-3 weeks ago and is unchaged in size. Patient denies any pain.  It is a size of a small pea.  No tenderness and no growth. No systemic symptoms. Patient is married and the father of 4 children ages 59 16,11 and 50.     Medications: Outpatient Medications Prior to Visit  Medication Sig   [DISCONTINUED] predniSONE (DELTASONE) 10 MG tablet Take 6 pills on day 1, 5 pills on day 2 and so on until complete. (Patient not taking: Reported on 10/28/2020)   No facility-administered medications prior to visit.    Review of Systems  Constitutional:  Negative for appetite change, chills and fever.  Respiratory:  Negative for chest tightness, shortness of breath and wheezing.   Cardiovascular:  Negative for chest pain and palpitations.  Gastrointestinal:  Negative for abdominal pain, nausea and vomiting.       Objective    BP 118/80 (BP Location: Left Arm, Patient Position: Sitting, Cuff Size: Large)   Pulse 72   Temp 98.2 F (36.8 C) (Temporal)   Resp 16   Wt 203 lb 6.4 oz (92.3 kg)   BMI 27.97 kg/m  BP Readings from Last 3 Encounters:  10/28/20 118/80  05/20/20 115/76  04/17/18 100/60   Wt Readings from Last 3 Encounters:  10/28/20 203 lb 6.4 oz (92.3 kg)  05/27/20 193 lb (87.5 kg)  05/20/20 198 lb (89.8 kg)       Physical Exam Vitals reviewed.  Constitutional:      Appearance: Normal appearance.   HENT:     Head: Normocephalic and atraumatic.     Right Ear: External ear normal.     Left Ear: External ear normal.  Eyes:     General: No scleral icterus.    Conjunctiva/sclera: Conjunctivae normal.  Cardiovascular:     Rate and Rhythm: Normal rate and regular rhythm.     Pulses: Normal pulses.     Heart sounds: Normal heart sounds.  Pulmonary:     Effort: Pulmonary effort is normal.     Breath sounds: Normal breath sounds.  Abdominal:     Palpations: Abdomen is soft.  Skin:    General: Skin is warm and dry.     Comments: There is a small sebaceous cyst in his lower abdomen that is not infected.  Neurological:     General: No focal deficit present.     Mental Status: He is alert and oriented to person, place, and time.  Psychiatric:        Mood and Affect: Mood normal.        Behavior: Behavior normal.        Thought Content: Thought content normal.        Judgment: Judgment normal.      No results found for any visits on 10/28/20.  Assessment & Plan  1. Sebaceous cyst No further work-up needed.  Patient advised to use hot compresses on the area if it grows or bothers  2. Encounter for vasectomy counseling Patient has four children ages 46, 59, 33 and 54. Patient does not want to have anymore children and expressed interest in getting a vasectomy. Will refer to Urology.  - Ambulatory referral to Urology  3. Colon cancer screening Refer to GI for screening colonoscopy at age 57 - Ambulatory referral to gastroenterology for colonoscopy   No follow-ups on file.      I, Wilhemena Durie, MD, have reviewed all documentation for this visit. The documentation on 11/13/20 for the exam, diagnosis, procedures, and orders are all accurate and complete.    Barkley Kratochvil Cranford Mon, MD  [facility] [phone] (phone) [phone] (fax)  [place]

## 2022-09-01 ENCOUNTER — Encounter: Payer: Self-pay | Admitting: Physician Assistant

## 2022-09-01 ENCOUNTER — Ambulatory Visit: Payer: 59 | Admitting: Physician Assistant

## 2022-09-01 VITALS — BP 121/85 | HR 68 | Temp 98.3°F | Resp 16 | Ht 71.0 in | Wt 204.4 lb

## 2022-09-01 DIAGNOSIS — J029 Acute pharyngitis, unspecified: Secondary | ICD-10-CM | POA: Diagnosis not present

## 2022-09-01 LAB — POCT RAPID STREP A (OFFICE): Rapid Strep A Screen: NEGATIVE

## 2022-09-01 MED ORDER — NYSTATIN 100000 UNIT/ML MT SUSP: 5.0000 mL | Freq: Four times a day (QID) | OROMUCOSAL | 1 refills | Status: DC | PRN

## 2022-09-01 MED ORDER — ALUM & MAG HYDROXIDE-SIMETH 200-200-20 MG/5ML PO SUSP
5.0000 mL | Freq: Four times a day (QID) | ORAL | 1 refills | Status: DC | PRN
Start: 2022-09-01 — End: 2022-11-12

## 2022-09-01 NOTE — Progress Notes (Signed)
Established patient visit  Patient: Frank Gray   DOB: 05/21/1975   47 y.o. Male  MRN: [id number] Visit Date: 09/01/2022  Today's healthcare provider: Debera Lat, PA-C   Chief Complaint  Patient presents with   Oral Swelling   Subjective    HPI  Throat Swelling: Reports swelling at the back of his throat x 2 days. Denies any fever, dyspnea or cold symptoms. Has tried using cough drops and sucking on ice with no relief   Admits having some sore throat Prior to the onset of swelling: No drug, no new food consumption No sick contact No fever No dental problems or URI, ear infection Denies ear pain Admits traveling down to the cost    10/28/2020    2:48 PM 05/20/2020    8:57 AM 04/17/2018    9:17 AM  Depression screen PHQ 2/9  Decreased Interest 0 0 0  Down, Depressed, Hopeless 0 0 0  PHQ - 2 Score 0 0 0  Altered sleeping 0 0   Tired, decreased energy 0 0   Change in appetite 0 0   Feeling bad or failure about yourself  0 0   Trouble concentrating 0 0   Moving slowly or fidgety/restless 0 0   Suicidal thoughts 0 0   PHQ-9 Score 0 0   Difficult doing work/chores Not difficult at all Not difficult at all     Medications: No outpatient medications prior to visit.   No facility-administered medications prior to visit.    Review of Systems  Constitutional:  Negative for appetite change, chills and fever.  Respiratory:  Negative for chest tightness, shortness of breath and wheezing.   Cardiovascular:  Negative for chest pain and palpitations.  Gastrointestinal:  Negative for abdominal pain, nausea and vomiting.   Except see HPI      Objective    BP 121/85   Pulse 68   Temp 98.3 F (36.8 C) (Oral)   Resp 16   Ht 5\' 11"  (1.803 m)   Wt 204 lb 6.4 oz (92.7 kg)   SpO2 98% Comment: room air  BMI 28.51 kg/m    Physical Exam Vitals reviewed.  Constitutional:      General: He is not in acute distress.    Appearance: Normal appearance. He is not  diaphoretic.  HENT:     Head: Normocephalic and atraumatic.  Eyes:     General: No scleral icterus.    Conjunctiva/sclera: Conjunctivae normal.  Cardiovascular:     Rate and Rhythm: Normal rate and regular rhythm.     Pulses: Normal pulses.     Heart sounds: Normal heart sounds. No murmur heard. Pulmonary:     Effort: Pulmonary effort is normal. No respiratory distress.     Breath sounds: Normal breath sounds. No wheezing or rhonchi.  Musculoskeletal:     Cervical back: Neck supple.     Right lower leg: No edema.     Left lower leg: No edema.  Lymphadenopathy:     Cervical: No cervical adenopathy.  Skin:    General: Skin is warm and dry.     Findings: No rash.  Neurological:     Mental Status: He is alert and oriented to person, place, and time. Mental status is at baseline.  Psychiatric:        Mood and Affect: Mood normal.        Behavior: Behavior normal.    Picture of the uvula swelling and white film covering on the tip of  uvula should be noted  No results found for any visits on 09/01/22.  Assessment & Plan    1. Sore throat With uvular swelling with white film covering it Could be allergy but pt denies breathing problem/anaphylaxis Advised to proceed with epipen, pt denied Could be yeast infection but pt denies having recent abx use or being immunocompromised in any way Could be infection but there are no other associated symptoms at this visit. Normal vitals  - POCT rapid strep A negative. - magic mouthwash (nystatin, lidocaine, diphenhydrAMINE, alum & mag hydroxide) suspension; Swish and spit 5 mLs 4 (four) times daily as needed for mouth pain.  Dispense: 180 mL; Refill: 1 - advised to call in antihistamines vs prednisone vs chlorhexidine solution, pt preffered to wait on it Will reassess in 2-3 days.  No follow-ups on file. Fu with Dr. Sherrie Mustache     The patient was advised to call back or seek an in-person evaluation if the symptoms worsen or if the condition  fails to improve as anticipated.  I discussed the assessment and treatment plan with the patient. The patient was provided an opportunity to ask questions and all were answered. The patient agreed with the plan and demonstrated an understanding of the instructions.  I, Debera Lat, PA-C have reviewed all documentation for this visit. The documentation on  5/2 9/69for the exam, diagnosis, procedures, and orders are all accurate and complete.  Debera Lat, [facility], MMS [facility] [phone] (phone) [phone] (fax)  [facility]

## 2022-11-08 ENCOUNTER — Telehealth: Payer: Self-pay

## 2022-11-08 ENCOUNTER — Ambulatory Visit: Payer: Self-pay

## 2022-11-08 ENCOUNTER — Encounter: Payer: Self-pay | Admitting: Family Medicine

## 2022-11-08 ENCOUNTER — Ambulatory Visit: Payer: 59 | Admitting: Family Medicine

## 2022-11-08 VITALS — BP 122/83 | HR 77 | Ht 71.0 in | Wt 204.9 lb

## 2022-11-08 DIAGNOSIS — K429 Umbilical hernia without obstruction or gangrene: Secondary | ICD-10-CM | POA: Diagnosis not present

## 2022-11-08 NOTE — Progress Notes (Signed)
      Established patient visit   Patient: Frank Gray   DOB: 1975/09/17   47 y.o. Male  MRN: [id number] Visit Date: 11/08/2022  Today's healthcare provider: Mila Merry, MD   Chief Complaint  Patient presents with   Abdominal Pain    Patient reports having umbilical hernia that is now beginning to cause pain. Reports pain being gradual and constant since this weekend and at a 6-7/10. Reports nothing relieving the pain and pain is associated with pressure. Patient reports he can push back in but it comes right back out.    Subjective    Discussed the use of AI scribe software for clinical note transcription with the patient, who gave verbal consent to proceed.  History of Present Illness   The patient, with a longstanding history of an umbilical hernia, reports a recent increase in discomfort and pressure in the area. He describes the hernia as having been present since childhood, but note a recent progression in its size and associated symptoms. The patient woke up with pain and pressure under the belly button the day before the consultation, which has persisted. Prior to this, he had noticed some swelling but minimal pain. The discomfort is described as being more pronounced on the back side of the hernia. The patient denies any tenderness to touch. He has not engaged in any unusually strenuous activity recently.       Medications: Outpatient Medications Prior to Visit  Medication Sig   magic mouthwash (nystatin, lidocaine, diphenhydrAMINE, alum & mag hydroxide) suspension Swish and spit 5 mLs 4 (four) times daily as needed for mouth pain. (Patient not taking: Reported on 11/08/2022)   No facility-administered medications prior to visit.        Objective    BP 122/83 (BP Location: Left Arm, Patient Position: Sitting, Cuff Size: Normal)   Pulse 77   Ht 5\' 11"  (1.803 m)   Wt 204 lb 14.4 oz (92.9 kg)   SpO2 99%   BMI 28.58 kg/m   Physical Exam   ABDOMEN: Umbilical  hernia present, no tenderness or erythema.    No results found for any visits on 11/08/22.  Assessment & Plan     Assessment and Plan    Umbilical Hernia Progressive swelling and recent onset of pain and pressure. No signs of infection on examination. -Refer to general surgery for evaluation and potential surgical repair. -Avoid strenuous activity and heavy lifting until surgical evaluation. -Contact patient if no communication from surgical team by end of the week.          Mila Merry, MD  [facility] [phone] (phone) [phone] (fax)  [facility]

## 2022-11-08 NOTE — Telephone Encounter (Signed)
Copied from CRM [id number]. Topic: Referral - Request for Referral >> Nov 08, 2022  8:18 AM Frank Gray wrote: Reason for CRM: umbilical hernia  Has patient seen PCP for this complaint? Yes.  (Pt states that he has had this for Gray while) *If NO, is insurance requiring patient see PCP for this issue before PCP can refer them? Referral for which specialty: Pt is unsure which speciality he need to be referred to. Preferred provider/office: Pt would like for PCP to do referral. Reason for referral: Pt states that he has had an umbilical hernia for Gray while, and he woke up yesterday in pain and is needing to know who to be referred to or what number to call.

## 2022-11-08 NOTE — Telephone Encounter (Signed)
  Pt states that he has had a umbilical hernia for a while and he woke up yesterday in pain and is having pain today. Please advise.     Chief Complaint: Abdominal pain at belly button. "Small protrusion." Symptoms: Above Frequency: This weekend Pertinent Negatives: Patient denies any other symptoms Disposition: [] ED /[] Urgent Care (no appt availability in office) / [x] Appointment(In office/virtual)/ []  [facility]/ [] Home Care/ [] Refused Recommended Disposition /[] [facility] [facility]/ []  Follow-up with PCP Additional Notes: Pt. Agrees with appointment.  Reason for Disposition  [1] MILD-MODERATE pain AND [2] constant AND [3] present > 2 hours  Answer Assessment - Initial Assessment Questions 1. LOCATION: "Where does it hurt?"      Belly button 2. RADIATION: "Does the pain shoot anywhere else?" (e.g., chest, back)     No 3. ONSET: "When did the pain begin?" (Minutes, hours or days ago)      This weekend 4. SUDDEN: "Gradual or sudden onset?"     Gradual 5. PATTERN "Does the pain come and go, or is it constant?"    - If it comes and goes: "How long does it last?" "Do you have pain now?"     (Note: Comes and goes means the pain is intermittent. It goes away completely between bouts.)    - If constant: "Is it getting better, staying the same, or getting worse?"      (Note: Constant means the pain never goes away completely; most serious pain is constant and gets worse.)      Constant 6. SEVERITY: "How bad is the pain?"  (e.g., Scale 1-10; mild, moderate, or severe)    - MILD (1-3): Doesn't interfere with normal activities, abdomen soft and not tender to touch.     - MODERATE (4-7): Interferes with normal activities or awakens from sleep, abdomen tender to touch.     - SEVERE (8-10): Excruciating pain, doubled over, unable to do any normal activities.       6-7 7. RECURRENT SYMPTOM: "Have you ever had this type of stomach pain before?" If Yes, ask: "When was the last time?"  and "What happened that time?"      Yes 8. CAUSE: "What do you think is causing the stomach pain?"     Hernia 9. RELIEVING/AGGRAVATING FACTORS: "What makes it better or worse?" (e.g., antacids, bending or twisting motion, bowel movement)     No 10. OTHER SYMPTOMS: "Do you have any other symptoms?" (e.g., back pain, diarrhea, fever, urination pain, vomiting)       Pressure  Protocols used: Abdominal Pain - Male-A-AH

## 2022-11-10 LAB — CBC AND DIFFERENTIAL
HCT: 43 (ref 41–53)
Hemoglobin: 14.8 (ref 13.5–17.5)
Neutrophils Absolute: 7.4
Platelets: 170 10*3/uL (ref 150–400)
WBC: 12.7

## 2022-11-10 LAB — TESTOSTERONE: Testosterone: 341

## 2022-11-10 LAB — PSA: PSA: 1.3

## 2022-11-10 LAB — CBC: RBC: 4.85 (ref 3.87–5.11)

## 2022-11-10 LAB — VITAMIN D 25 HYDROXY (VIT D DEFICIENCY, FRACTURES): Vit D, 25-Hydroxy: 119

## 2022-11-11 ENCOUNTER — Ambulatory Visit: Payer: 59 | Admitting: Surgery

## 2022-11-11 ENCOUNTER — Encounter: Payer: Self-pay | Admitting: Family Medicine

## 2022-11-12 ENCOUNTER — Telehealth (INDEPENDENT_AMBULATORY_CARE_PROVIDER_SITE_OTHER): Payer: 59 | Admitting: Family Medicine

## 2022-11-12 ENCOUNTER — Encounter: Payer: Self-pay | Admitting: Family Medicine

## 2022-11-12 DIAGNOSIS — D72829 Elevated white blood cell count, unspecified: Secondary | ICD-10-CM

## 2022-11-12 NOTE — Progress Notes (Signed)
MyChart Video Visit    Virtual Visit via Video Note   This format is felt to be most appropriate for this patient at this time. Physical exam was limited by quality of the video and audio technology used for the visit.   Patient location: home Provider location: [facility]  I discussed the limitations of evaluation and management by telemedicine and the availability of in person appointments. The patient expressed understanding and agreed to proceed.  Patient: Frank Gray   DOB: [birth year]   47 y.o. Male  MRN: [id number] Visit Date: 11/12/2022  Today's healthcare provider: Mila Merry, MD   Chief Complaint  Patient presents with   lab result   Subjective     History of Present Illness   The patient, who is currently on testosterone replacement therapy (BHRT) through [facility], presented with concerns about recent blood work results. The blood work, which is routinely done every six months as part of his BHRT, was performed at [facility] a few days prior. The patient expressed concern about an elevated white blood cell count, which was a deviation from his typically normal results. he is noted to have WBC of 12.7, and mildly elevate absolute neutrophils, lymphs, and monocytes, but normal differential percentages.   The patient also reported taking vitamin D supplements, which may have contributed to the high vitamin D levels of 119.0. He plans to reduce his intake of these supplements.  In addition to the blood work concerns, the patient mentioned an umbilical hernia issue. However, he did not feel unwell or experience any symptoms that could be associated with an infection or the elevated white blood cell count. He is not on any new medications and does not take over-the-counter medicines like Tylenol or Motrin.  The patient has been on testosterone for a couple of years as part of his BHRT, and the dosage has not changed recently.      Medications: Outpatient Medications Prior  to Visit  Medication Sig   [DISCONTINUED] magic mouthwash (nystatin, lidocaine, diphenhydrAMINE, alum & mag hydroxide) suspension Swish and spit 5 mLs 4 (four) times daily as needed for mouth pain. (Patient not taking: Reported on 11/08/2022)   No facility-administered medications prior to visit.    Review of Systems  Constitutional:  Negative for appetite change, chills and fever.  Respiratory:  Negative for chest tightness, shortness of breath and wheezing.   Cardiovascular:  Negative for chest pain and palpitations.  Gastrointestinal:  Negative for abdominal pain, nausea and vomiting.     Objective    There were no vitals taken for this visit.   Physical Exam  Awake, alert, oriented x 3. In no apparent distress       Assessment & Plan     Assessment and Plan    Elevated White Blood Cell Count Recent labs showed elevated WBC count. No recent illness or new medications reported. Patient is asymptomatic. Discussed potential causes including infection, stress, and malignancies. -Repeat CBC with differential next week to assess if elevation persists.  Vitamin D Supplementation Recent labs showed high Vitamin D levels. Patient is currently taking Vitamin D supplements. -Advise patient to reduce Vitamin D supplementation.  Umbilical Hernia Patient has a scheduled appointment with a surgeon for a hernia issue. No acute symptoms reported. -Continue with scheduled surgical consultation.       No follow-ups on file.     I discussed the assessment and treatment plan with the patient. The patient was provided an opportunity to ask questions  and all were answered. The patient agreed with the plan and demonstrated an understanding of the instructions.   The patient was advised to call back or seek an in-person evaluation if the symptoms worsen or if the condition fails to improve as anticipated.  I provided 9 minutes of non-face-to-face time during this encounter.    Mila Merry, MD [facility] [phone] (phone) [phone] (fax)  [facility]

## 2022-11-17 NOTE — Progress Notes (Unsigned)
Patient ID: Frank Gray, male   DOB: [birth year], 47 y.o.   MRN: [id number]  Chief Complaint: Umbilical hernia  History of Present Illness Frank Gray is a 47 y.o. male with umbilical hernia, pretty much present all his life.  Has gradually become bigger over time.  He has had approximately 2 weeks where there is been increasing pain and pressure in the area.  It has remained reducible, but it quickly recurs.  He has had no bowel habit changes, no difficulty voiding, no constipation and no persistent heavy work or heavy lifting that has provoked this.  He enjoys golfing and looks forward to getting back to this.  Past Medical History History reviewed. No pertinent past medical history.    Past Surgical History:  Procedure Laterality Date   BASAL CELL CARCINOMA EXCISION      No Known Allergies  No current outpatient medications on file.   No current facility-administered medications for this visit.    Family History Family History  Problem Relation Age of Onset   Dementia Mother    Cancer Neg Hx       Social History Social History   Tobacco Use   Smoking status: Never   Smokeless tobacco: Never  Substance Use Topics   Alcohol use: Yes    Alcohol/week: 5.0 - 6.0 standard drinks of alcohol    Types: 5 - 6 Cans of beer per week   Drug use: No        Review of Systems  Constitutional: Negative.   HENT: Negative.    Eyes: Negative.   Respiratory: Negative.    Cardiovascular: Negative.   Gastrointestinal: Negative.   Genitourinary: Negative.   Skin: Negative.   Neurological: Negative.   Psychiatric/Behavioral: Negative.       Physical Exam Blood pressure (!) 142/78, pulse 78, temperature 98.3 F (36.8 C), temperature source Oral, height 5\' 11"  (1.803 m), weight 202 lb 12.8 oz (92 kg), SpO2 98%. Last Weight  Most recent update: 11/18/2022  8:47 AM    Weight  92 kg (202 lb 12.8 oz)             CONSTITUTIONAL: Well developed, and nourished,  appropriately responsive and aware without distress.   EYES: Sclera non-icteric.   EARS, NOSE, MOUTH AND THROAT:  The oropharynx is clear. Oral mucosa is pink and moist.    Hearing is intact to voice.  NECK: Trachea is midline, and there is no jugular venous distension.  LYMPH NODES:  Lymph nodes in the neck are not appreciated. RESPIRATORY:  Lungs are clear, and breath sounds are equal bilaterally.  Normal respiratory effort without pathologic use of accessory muscles. CARDIOVASCULAR: Heart is regular in rate and rhythm.   Well perfused.  GI: The abdomen is notable for irregular bulging skin at the umbilicus, with a reducible soft tissue present to a fascial defect estimated at 1.5 to 2 cm, otherwise soft, nontender, and nondistended. There were no palpable masses.  I did not appreciate hepatosplenomegaly. MUSCULOSKELETAL:  Symmetrical muscle tone appreciated in all four extremities.    SKIN: Skin turgor is normal. No pathologic skin lesions appreciated.  NEUROLOGIC:  Motor and sensation appear grossly normal.  Cranial nerves are grossly without defect. PSYCH:  Alert and oriented to person, place and time. Affect is appropriate for situation.  Data Reviewed I have personally reviewed what is currently available of the patient's imaging, recent labs and medical records.   Labs:     Latest Ref Rng & Units  11/15/2022    8:27 AM 11/10/2022   12:00 AM 05/20/2020    9:34 AM  CBC  WBC 3.4 - 10.8 x10E3/uL 10.1  12.7     8.9   Hemoglobin 13.0 - 17.7 g/dL 86.5  78.4     69.6   Hematocrit 37.5 - 51.0 % 43.7  43     45.1   Platelets 150 - 450 x10E3/uL 235  170     201      This result is from an external source.      Latest Ref Rng & Units 05/20/2020    9:34 AM 04/17/2018    9:18 AM 01/06/2017    8:24 AM  CMP  Glucose 65 - 99 mg/dL 295  93  284   BUN 6 - 24 mg/dL 11  11  14    Creatinine 0.76 - 1.27 mg/dL 1.32  4.40  1.02   Sodium 134 - 144 mmol/L 140  143  140   Potassium 3.5 - 5.2 mmol/L 4.3   4.4  4.4   Chloride 96 - 106 mmol/L 100  102  104   CO2 20 - 29 mmol/L 25  24  30    Calcium 8.7 - 10.2 mg/dL 9.7  9.3  9.0   Total Protein 6.0 - 8.5 g/dL 7.2  7.3  6.9   Total Bilirubin 0.0 - 1.2 mg/dL 0.5  0.4  0.4   Alkaline Phos 44 - 121 IU/L 93  86    AST 0 - 40 IU/L 19  21  18    ALT 0 - 44 IU/L 17  18  14        Imaging: Radiological images reviewed:   Within last 24 hrs: No results found.  Assessment    Umbilical hernia, nonrecurrent, reducible, estimated fascial size less than 2 cm. Patient Active Problem List   Diagnosis Date Noted   Umbilical hernia without obstruction and without gangrene 05/20/2020   History of chicken pox 09/17/2015    Plan    Open umbilical hernia repair, initial, reducible, estimated fascial defect less than 3 cm.   Face-to-face time spent with the patient and accompanying care providers(if present) was 25 minutes, with more than 50% of the time spent counseling, educating, and coordinating care of the patient.    These notes generated with voice recognition software. I apologize for typographical errors.  Campbell Lerner M.D., FACS 11/18/2022, 9:09 AM

## 2022-11-17 NOTE — H&P (View-Only) (Signed)
 Patient ID: Frank Gray, male   DOB: [birth year], 47 y.o.   MRN: [id number]  Chief Complaint: Umbilical hernia  History of Present Illness Frank Gray is a 47 y.o. male with umbilical hernia, pretty much present all his life.  Has gradually become bigger over time.  He has had approximately 2 weeks where there is been increasing pain and pressure in the area.  It has remained reducible, but it quickly recurs.  He has had no bowel habit changes, no difficulty voiding, no constipation and no persistent heavy work or heavy lifting that has provoked this.  He enjoys golfing and looks forward to getting back to this.  Past Medical History History reviewed. No pertinent past medical history.    Past Surgical History:  Procedure Laterality Date   BASAL CELL CARCINOMA EXCISION      No Known Allergies  No current outpatient medications on file.   No current facility-administered medications for this visit.    Family History Family History  Problem Relation Age of Onset   Dementia Mother    Cancer Neg Hx       Social History Social History   Tobacco Use   Smoking status: Never   Smokeless tobacco: Never  Substance Use Topics   Alcohol use: Yes    Alcohol/week: 5.0 - 6.0 standard drinks of alcohol    Types: 5 - 6 Cans of beer per week   Drug use: No        Review of Systems  Constitutional: Negative.   HENT: Negative.    Eyes: Negative.   Respiratory: Negative.    Cardiovascular: Negative.   Gastrointestinal: Negative.   Genitourinary: Negative.   Skin: Negative.   Neurological: Negative.   Psychiatric/Behavioral: Negative.       Physical Exam Blood pressure (!) 142/78, pulse 78, temperature 98.3 F (36.8 C), temperature source Oral, height 5\' 11"  (1.803 m), weight 202 lb 12.8 oz (92 kg), SpO2 98%. Last Weight  Most recent update: 11/18/2022  8:47 AM    Weight  92 kg (202 lb 12.8 oz)             CONSTITUTIONAL: Well developed, and nourished,  appropriately responsive and aware without distress.   EYES: Sclera non-icteric.   EARS, NOSE, MOUTH AND THROAT:  The oropharynx is clear. Oral mucosa is pink and moist.    Hearing is intact to voice.  NECK: Trachea is midline, and there is no jugular venous distension.  LYMPH NODES:  Lymph nodes in the neck are not appreciated. RESPIRATORY:  Lungs are clear, and breath sounds are equal bilaterally.  Normal respiratory effort without pathologic use of accessory muscles. CARDIOVASCULAR: Heart is regular in rate and rhythm.   Well perfused.  GI: The abdomen is notable for irregular bulging skin at the umbilicus, with a reducible soft tissue present to a fascial defect estimated at 1.5 to 2 cm, otherwise soft, nontender, and nondistended. There were no palpable masses.  I did not appreciate hepatosplenomegaly. MUSCULOSKELETAL:  Symmetrical muscle tone appreciated in all four extremities.    SKIN: Skin turgor is normal. No pathologic skin lesions appreciated.  NEUROLOGIC:  Motor and sensation appear grossly normal.  Cranial nerves are grossly without defect. PSYCH:  Alert and oriented to person, place and time. Affect is appropriate for situation.  Data Reviewed I have personally reviewed what is currently available of the patient's imaging, recent labs and medical records.   Labs:     Latest Ref Rng & Units  11/15/2022    8:27 AM 11/10/2022   12:00 AM 05/20/2020    9:34 AM  CBC  WBC 3.4 - 10.8 x10E3/uL 10.1  12.7     8.9   Hemoglobin 13.0 - 17.7 g/dL 86.5  78.4     69.6   Hematocrit 37.5 - 51.0 % 43.7  43     45.1   Platelets 150 - 450 x10E3/uL 235  170     201      This result is from an external source.      Latest Ref Rng & Units 05/20/2020    9:34 AM 04/17/2018    9:18 AM 01/06/2017    8:24 AM  CMP  Glucose 65 - 99 mg/dL 295  93  284   BUN 6 - 24 mg/dL 11  11  14    Creatinine 0.76 - 1.27 mg/dL 1.32  4.40  1.02   Sodium 134 - 144 mmol/L 140  143  140   Potassium 3.5 - 5.2 mmol/L 4.3   4.4  4.4   Chloride 96 - 106 mmol/L 100  102  104   CO2 20 - 29 mmol/L 25  24  30    Calcium 8.7 - 10.2 mg/dL 9.7  9.3  9.0   Total Protein 6.0 - 8.5 g/dL 7.2  7.3  6.9   Total Bilirubin 0.0 - 1.2 mg/dL 0.5  0.4  0.4   Alkaline Phos 44 - 121 IU/L 93  86    AST 0 - 40 IU/L 19  21  18    ALT 0 - 44 IU/L 17  18  14        Imaging: Radiological images reviewed:   Within last 24 hrs: No results found.  Assessment    Umbilical hernia, nonrecurrent, reducible, estimated fascial size less than 2 cm. Patient Active Problem List   Diagnosis Date Noted   Umbilical hernia without obstruction and without gangrene 05/20/2020   History of chicken pox 09/17/2015    Plan    Open umbilical hernia repair, initial, reducible, estimated fascial defect less than 3 cm.   Face-to-face time spent with the patient and accompanying care providers(if present) was 25 minutes, with more than 50% of the time spent counseling, educating, and coordinating care of the patient.    These notes generated with voice recognition software. I apologize for typographical errors.  Campbell Lerner M.D., FACS 11/18/2022, 9:09 AM

## 2022-11-18 ENCOUNTER — Ambulatory Visit: Payer: Self-pay | Admitting: Surgery

## 2022-11-18 ENCOUNTER — Ambulatory Visit: Payer: 59 | Admitting: Surgery

## 2022-11-18 ENCOUNTER — Encounter: Payer: Self-pay | Admitting: Surgery

## 2022-11-18 VITALS — BP 142/78 | HR 78 | Temp 98.3°F | Ht 71.0 in | Wt 202.8 lb

## 2022-11-18 DIAGNOSIS — K429 Umbilical hernia without obstruction or gangrene: Secondary | ICD-10-CM

## 2022-11-18 NOTE — Patient Instructions (Signed)
 Our surgery scheduler Britta Mccreedy will call you within 24-48 hours to get you scheduled. If you have not heard from her after 48 hours, please call our office. Have the blue sheet available when she calls to write down important information.   If you have any concerns or questions, please feel free to call our office.   Umbilical Hernia, Adult  A hernia is a lump of tissue that pushes through an opening in the muscles. An umbilical hernia happens in the belly, near the belly button. The hernia may contain tissues from the small or large intestine. It may also have fatty tissue that covers the intestines. Umbilical hernias in adults may get worse over time. They need to be treated with surgery. There are several types of umbilical hernias. They include: Indirect hernia. This occurs just above or below the belly button. It's the most common type of umbilical hernia in adults. Direct hernia. This type occurs in an opening that's formed by the belly button. Reducible hernia. This hernia comes and goes. You may see it only when you strain, cough, or lift something heavy. This type of hernia can be pushed back into the belly (reduced). Incarcerated hernia. This traps the hernia in the wall of the belly. This type of hernia can't be pushed back into the belly. It can cause a strangulated hernia. Strangulated hernia. This hernia cuts off blood flow to the tissues inside the hernia. The tissues can die if this happens. This type of hernia must be treated right away. What are the causes? An umbilical hernia happens when tissue inside the belly pushes through an opening in the muscles of the belly. What increases the risk? You're more likely to get this hernia if: You strain while lifting or pushing heavy objects. You've had several pregnancies. You have a condition that puts pressure on your belly, and you've had it for a long time. These include: Obesity. A buildup of fluid inside your belly. Vomiting or  coughing all the time. Trouble pooping (constipation). You've had surgery that weakened the muscles in the belly. What are the signs or symptoms? The main symptom of this condition is a bulge at the belly button or near it. The bulge does not cause pain. Other symptoms depend on the type of hernia you have. A reducible hernia may be seen only when you strain, cough, or lift something heavy. Other symptoms may include: Dull pain. A feeling of pressure. An incarcerated hernia may cause very bad pain. Also, you may: Vomit or feel like you may vomit. Not be able to pass gas. A strangulated hernia may cause: Pain that gets worse and worse. Vomiting, or feeling like you may vomit. Pain when you press on the hernia. Change of color on the skin over the hernia. The skin may become red or purple. Trouble pooping. Blood in the poop. How is this diagnosed? This condition may be diagnosed based on: Your symptoms and medical history. A physical exam. You may be asked to cough or strain while standing. These actions will put pressure inside your belly. The pressure can force the hernia through the opening in your muscles. Your health care provider may try to push the hernia back into your belly (reduce). How is this treated? Surgery is the only treatment for an umbilical hernia. Surgery for a strangulated hernia must be done right away. If you have a small hernia that's not incarcerated, you may need to lose weight before the surgery is done. Follow these instructions  at home: Managing constipation You may need to take these actions to prevent trouble pooping. This will help to prevent straining. Drink enough fluid to keep your pee (urine) pale yellow. Take over-the-counter or prescription medicines. Eat foods that are high in fiber, such as beans, whole grains, and fresh fruits and vegetables. Limit foods that are high in fat and sugars, such as fried or sweet foods. General instructions Do  not try to push the hernia back in. Lose weight, if told by your provider. Watch your hernia for any changes in color or size. Tell your provider if any changes occur. You may need to avoid activities that put pressure on your hernia. You may have to avoid lifting. Ask your provider how much you can safely lift. Take over-the-counter and prescription medicines only as told by your provider. Contact a health care provider if: Your hernia gets larger or feels hard. Your hernia becomes painful. You get a fever or chills. Get help right away if: You get very bad pain near the area of the hernia, and the pain comes on suddenly. You have pain and you vomit or feel like you may vomit. The skin over your hernia changes color. These symptoms may be an emergency. Get help right away. Call 911. Do not wait to see if the symptoms go away. Do not drive yourself to the hospital. This information is not intended to replace advice given to you by your health care provider. Make sure you discuss any questions you have with your health care provider. Document Revised: 07/13/2022 Document Reviewed: 07/13/2022 Elsevier Patient Education  2024 [employer].

## 2022-11-19 ENCOUNTER — Telehealth: Payer: Self-pay | Admitting: Surgery

## 2022-11-19 NOTE — Telephone Encounter (Signed)
Patient has been advised of Pre-Admission date/time, and Surgery date at [facility].  Surgery Date: 11/29/22 Preadmission Testing Date: 11/24/22 (phone 1p-4p)  Patient has been made aware to call [phone], between 1-3:00pm the day before surgery, to find out what time to arrive for surgery.

## 2022-11-24 ENCOUNTER — Encounter
Admission: RE | Admit: 2022-11-24 | Discharge: 2022-11-24 | Disposition: A | Discharge status: Home / Self Care | Payer: 59 | Source: Ambulatory Visit | Attending: Surgery | Admitting: Surgery

## 2022-11-24 HISTORY — DX: Personal history of (healed) traumatic fracture: Z87.81

## 2022-11-24 HISTORY — DX: Umbilical hernia without obstruction or gangrene: K42.9

## 2022-11-24 HISTORY — DX: Personal history of other infectious and parasitic diseases: Z86.19

## 2022-11-24 HISTORY — DX: Basal cell carcinoma of skin, unspecified: C44.91

## 2022-11-24 NOTE — Patient Instructions (Signed)
Your procedure is scheduled on:11-29-22 Monday Report to the Registration Desk on the 1st floor of the [facility].Then proceed to the 2nd floor Surgery Desk To find out your arrival time, please call [phone] between 1PM - 3PM on:11-26-22 Friday If your arrival time is 6:00 am, do not arrive before that time as the [facility] entrance doors do not open until 6:00 am.  REMEMBER: Instructions that are not followed completely may result in serious medical risk, up to and including death; or upon the discretion of your surgeon and anesthesiologist your surgery may need to be rescheduled.  Do not eat food OR drink any liquids after midnight the night before surgery.  No gum chewing or hard candies.  One week prior to surgery: Stop Anti-inflammatories (NSAIDS) such as Advil, Aleve, Ibuprofen, Motrin, Naproxen, Naprosyn and Aspirin based products such as Excedrin, Goody's Powder, BC Powder.You may however, take Tylenol if needed for pain up until the day of surgery. Stop ANY OVER THE COUNTER supplements/vitamins NOW (11-24-22) until after surgery.  Do NOT take any medication the day of surgery  No Alcohol for 24 hours before or after surgery.  No Smoking including e-cigarettes for 24 hours before surgery.  No chewable tobacco products for at least 6 hours before surgery.  No nicotine patches on the day of surgery.  Do not use any "recreational" drugs for at least a week (preferably 2 weeks) before your surgery.  Please be advised that the combination of cocaine and anesthesia may have negative outcomes, up to and including death. If you test positive for cocaine, your surgery will be cancelled.  On the morning of surgery brush your teeth with toothpaste and water, you may rinse your mouth with mouthwash if you wish. Do not swallow any toothpaste or mouthwash.  Use CHG Soap as directed on instruction sheet.  Do not wear jewelry, make-up, hairpins, clips or nail polish.  Do not wear  lotions, powders, or perfumes.   Do not shave body hair from the neck down 48 hours before surgery.  Contact lenses, hearing aids and dentures may not be worn into surgery.  Do not bring valuables to the hospital. [facility] is not responsible for any missing/lost belongings or valuables.  Notify your doctor if there is any change in your medical condition (cold, fever, infection).  Wear comfortable clothing (specific to your surgery type) to the hospital.  After surgery, you can help prevent lung complications by doing breathing exercises.  Take deep breaths and cough every 1-2 hours. Your doctor may order a device called an Incentive Spirometer to help you take deep breaths. When coughing or sneezing, hold a pillow firmly against your incision with both hands. This is called "splinting." Doing this helps protect your incision. It also decreases belly discomfort.  If you are being admitted to the hospital overnight, leave your suitcase in the car. After surgery it may be brought to your room.  In case of increased patient census, it may be necessary for you, the patient, to continue your postoperative care in the Same Day Surgery department.  If you are being discharged the day of surgery, you will not be allowed to drive home. You will need a responsible individual to drive you home and stay with you for 24 hours after surgery.   If you are taking public transportation, you will need to have a responsible individual with you.  Please call the Pre-admissions Testing Dept. at [phone] if you have any questions about  these instructions.  Surgery Visitation Policy:  Patients having surgery or a procedure may have two visitors.  Children under the age of 60 must have an adult with them who is not the patient.     Preparing for Surgery with CHLORHEXIDINE GLUCONATE (CHG) Soap  Chlorhexidine Gluconate (CHG) Soap  o An antiseptic cleaner that kills germs and bonds with the skin  to continue killing germs even after washing  o Used for showering the night before surgery and morning of surgery  Before surgery, you can play an important role by reducing the number of germs on your skin.  CHG (Chlorhexidine gluconate) soap is an antiseptic cleanser which kills germs and bonds with the skin to continue killing germs even after washing.  Please do not use if you have an allergy to CHG or antibacterial soaps. If your skin becomes reddened/irritated stop using the CHG.  1. Shower the NIGHT BEFORE SURGERY and the MORNING OF SURGERY with CHG soap.  2. If you choose to wash your hair, wash your hair first as usual with your normal shampoo.  3. After shampooing, rinse your hair and body thoroughly to remove the shampoo.  4. Use CHG as you would any other liquid soap. You can apply CHG directly to the skin and wash gently with a scrungie or a clean washcloth.  5. Apply the CHG soap to your body only from the neck down. Do not use on open wounds or open sores. Avoid contact with your eyes, ears, mouth, and genitals (private parts). Wash face and genitals (private parts) with your normal soap.  6. Wash thoroughly, paying special attention to the area where your surgery will be performed.  7. Thoroughly rinse your body with warm water.  8. Do not shower/wash with your normal soap after using and rinsing off the CHG soap.  9. Pat yourself dry with a clean towel.  10. Wear clean pajamas to bed the night before surgery.  12. Place clean sheets on your bed the night of your first shower and do not sleep with pets.  13. Shower again with the CHG soap on the day of surgery prior to arriving at the hospital.  14. Do not apply any deodorants/lotions/powders.  15. Please wear clean clothes to the hospital.

## 2022-11-28 MED ORDER — CHLORHEXIDINE GLUCONATE 0.12 % MT SOLN
15.0000 mL | Freq: Once | OROMUCOSAL | Status: AC
  Administered 2022-11-29: 15 mL via OROMUCOSAL

## 2022-11-28 MED ORDER — GABAPENTIN 300 MG PO CAPS
300.0000 mg | ORAL_CAPSULE | ORAL | Status: AC
  Administered 2022-11-29: 300 mg via ORAL

## 2022-11-28 MED ORDER — ORAL CARE MOUTH RINSE: 15.0000 mL | Freq: Once | OROMUCOSAL | Status: AC

## 2022-11-28 MED ORDER — CEFAZOLIN SODIUM-DEXTROSE 2-4 GM/100ML-% IV SOLN
2.0000 g | INTRAVENOUS | Status: AC
  Administered 2022-11-29: 2 g via INTRAVENOUS

## 2022-11-28 MED ORDER — CHLORHEXIDINE GLUCONATE CLOTH 2 % EX PADS: 6.0000 | MEDICATED_PAD | Freq: Once | CUTANEOUS | Status: DC

## 2022-11-28 MED ORDER — CELECOXIB 200 MG PO CAPS
200.0000 mg | ORAL_CAPSULE | ORAL | Status: AC
  Administered 2022-11-29: 200 mg via ORAL

## 2022-11-28 MED ORDER — ACETAMINOPHEN 500 MG PO TABS
1000.0000 mg | ORAL_TABLET | ORAL | Status: AC
  Administered 2022-11-29: 1000 mg via ORAL

## 2022-11-28 MED ORDER — BUPIVACAINE LIPOSOME 1.3 % IJ SUSP: 20.0000 mL | Freq: Once | INTRAMUSCULAR | Status: DC

## 2022-11-28 MED ORDER — FAMOTIDINE 20 MG PO TABS
20.0000 mg | ORAL_TABLET | Freq: Once | ORAL | Status: AC
  Administered 2022-11-29: 20 mg via ORAL

## 2022-11-28 MED ORDER — LACTATED RINGERS IV SOLN: INTRAVENOUS | Status: DC

## 2022-11-29 ENCOUNTER — Encounter
Admission: RE | Disposition: A | Discharge status: Home / Self Care | Payer: Self-pay | Source: Home / Self Care | Attending: Surgery

## 2022-11-29 ENCOUNTER — Ambulatory Visit: Payer: 59 | Admitting: Certified Registered"

## 2022-11-29 ENCOUNTER — Other Ambulatory Visit: Payer: Self-pay

## 2022-11-29 ENCOUNTER — Ambulatory Visit
Admission: RE | Admit: 2022-11-29 | Discharge: 2022-11-29 | Disposition: A | Discharge status: Home / Self Care | Payer: 59 | Attending: Surgery | Admitting: Surgery

## 2022-11-29 ENCOUNTER — Encounter: Payer: Self-pay | Admitting: Surgery

## 2022-11-29 ENCOUNTER — Ambulatory Visit: Payer: 59 | Admitting: Urgent Care

## 2022-11-29 DIAGNOSIS — K420 Umbilical hernia with obstruction, without gangrene: Secondary | ICD-10-CM | POA: Diagnosis not present

## 2022-11-29 DIAGNOSIS — K429 Umbilical hernia without obstruction or gangrene: Secondary | ICD-10-CM | POA: Diagnosis not present

## 2022-11-29 HISTORY — PX: UMBILICAL HERNIA REPAIR: SHX196

## 2022-11-29 SURGERY — REPAIR, HERNIA, UMBILICAL, ADULT
Anesthesia: General

## 2022-11-29 MED ORDER — MIDAZOLAM HCL 2 MG/2ML IJ SOLN
INTRAMUSCULAR | Status: AC
  Filled 2022-11-29: qty 2

## 2022-11-29 MED ORDER — FENTANYL CITRATE (PF) 100 MCG/2ML IJ SOLN: 25.0000 ug | INTRAMUSCULAR | Status: DC | PRN

## 2022-11-29 MED ORDER — DEXMEDETOMIDINE HCL IN NACL 80 MCG/20ML IV SOLN
INTRAVENOUS | Status: DC | PRN
  Administered 2022-11-29: 12 ug via INTRAVENOUS
  Administered 2022-11-29: 8 ug via INTRAVENOUS

## 2022-11-29 MED ORDER — PROPOFOL 1000 MG/100ML IV EMUL
INTRAVENOUS | Status: AC
  Filled 2022-11-29: qty 100

## 2022-11-29 MED ORDER — BUPIVACAINE LIPOSOME 1.3 % IJ SUSP
INTRAMUSCULAR | Status: AC
  Filled 2022-11-29: qty 20

## 2022-11-29 MED ORDER — CHLORHEXIDINE GLUCONATE 0.12 % MT SOLN
OROMUCOSAL | Status: AC
  Filled 2022-11-29: qty 15

## 2022-11-29 MED ORDER — FENTANYL CITRATE (PF) 100 MCG/2ML IJ SOLN
INTRAMUSCULAR | Status: DC | PRN
  Administered 2022-11-29: 25 ug via INTRAVENOUS
  Administered 2022-11-29 (×2): 50 ug via INTRAVENOUS
  Administered 2022-11-29: 25 ug via INTRAVENOUS

## 2022-11-29 MED ORDER — GABAPENTIN 300 MG PO CAPS
ORAL_CAPSULE | ORAL | Status: AC
  Filled 2022-11-29: qty 1

## 2022-11-29 MED ORDER — FENTANYL CITRATE (PF) 100 MCG/2ML IJ SOLN
INTRAMUSCULAR | Status: AC
  Filled 2022-11-29: qty 2

## 2022-11-29 MED ORDER — BUPIVACAINE-EPINEPHRINE (PF) 0.25% -1:200000 IJ SOLN
INTRAMUSCULAR | Status: AC
  Filled 2022-11-29: qty 30

## 2022-11-29 MED ORDER — BUPIVACAINE-EPINEPHRINE 0.25% -1:200000 IJ SOLN
INTRAMUSCULAR | Status: DC | PRN
  Administered 2022-11-29: 30 mL

## 2022-11-29 MED ORDER — CEFAZOLIN SODIUM-DEXTROSE 2-4 GM/100ML-% IV SOLN
INTRAVENOUS | Status: AC
  Filled 2022-11-29: qty 100

## 2022-11-29 MED ORDER — MIDAZOLAM HCL 2 MG/2ML IJ SOLN
INTRAMUSCULAR | Status: DC | PRN
  Administered 2022-11-29: 2 mg via INTRAVENOUS

## 2022-11-29 MED ORDER — ONDANSETRON HCL 4 MG/2ML IJ SOLN
INTRAMUSCULAR | Status: DC | PRN
  Administered 2022-11-29: 4 mg via INTRAVENOUS

## 2022-11-29 MED ORDER — ACETAMINOPHEN 500 MG PO TABS
ORAL_TABLET | ORAL | Status: AC
  Filled 2022-11-29: qty 2

## 2022-11-29 MED ORDER — IBUPROFEN 800 MG PO TABS: 800.0000 mg | ORAL_TABLET | Freq: Three times a day (TID) | ORAL | 0 refills | Status: DC | PRN

## 2022-11-29 MED ORDER — PROPOFOL 10 MG/ML IV BOLUS
INTRAVENOUS | Status: DC | PRN
  Administered 2022-11-29: 100 mg via INTRAVENOUS
  Administered 2022-11-29: 150 mg via INTRAVENOUS
  Administered 2022-11-29: 30 mg via INTRAVENOUS
  Administered 2022-11-29: 50 mg via INTRAVENOUS

## 2022-11-29 MED ORDER — HYDROCODONE-ACETAMINOPHEN 5-325 MG PO TABS: 1.0000 | ORAL_TABLET | Freq: Four times a day (QID) | ORAL | 0 refills | Status: DC | PRN

## 2022-11-29 MED ORDER — LIDOCAINE HCL (CARDIAC) PF 100 MG/5ML IV SOSY
PREFILLED_SYRINGE | INTRAVENOUS | Status: DC | PRN
  Administered 2022-11-29: 100 mg via INTRAVENOUS
  Administered 2022-11-29: 20 mg via INTRAVENOUS

## 2022-11-29 MED ORDER — GLYCOPYRROLATE 0.2 MG/ML IJ SOLN
INTRAMUSCULAR | Status: DC | PRN
Start: 2022-11-29 — End: 2022-11-29
  Administered 2022-11-29: .2 mg via INTRAVENOUS

## 2022-11-29 MED ORDER — DEXAMETHASONE SODIUM PHOSPHATE 10 MG/ML IJ SOLN
INTRAMUSCULAR | Status: DC | PRN
  Administered 2022-11-29: 10 mg via INTRAVENOUS

## 2022-11-29 MED ORDER — DROPERIDOL 2.5 MG/ML IJ SOLN: 0.6250 mg | Freq: Once | INTRAMUSCULAR | Status: DC | PRN

## 2022-11-29 MED ORDER — CELECOXIB 200 MG PO CAPS
ORAL_CAPSULE | ORAL | Status: AC
  Filled 2022-11-29: qty 1

## 2022-11-29 MED ORDER — EPHEDRINE SULFATE (PRESSORS) 50 MG/ML IJ SOLN
INTRAMUSCULAR | Status: DC | PRN
Start: 2022-11-29 — End: 2022-11-29
  Administered 2022-11-29 (×2): 5 mg via INTRAVENOUS

## 2022-11-29 MED ORDER — BUPIVACAINE LIPOSOME 1.3 % IJ SUSP
INTRAMUSCULAR | Status: DC | PRN
  Administered 2022-11-29: 20 mL

## 2022-11-29 MED ORDER — FAMOTIDINE 20 MG PO TABS
ORAL_TABLET | ORAL | Status: AC
  Filled 2022-11-29: qty 1

## 2022-11-29 SURGICAL SUPPLY — 34 items
ADH SKN CLS APL DERMABOND .7 (GAUZE/BANDAGES/DRESSINGS) ×1
APL PRP STRL LF DISP 70% ISPRP (MISCELLANEOUS) ×1
BINDER ABDOMINAL 12 ML 46-62 (SOFTGOODS) IMPLANT
BLADE CLIPPER SURG (BLADE) IMPLANT
BLADE SURG 15 STRL LF DISP TIS (BLADE) ×1 IMPLANT
BLADE SURG 15 STRL SS (BLADE) ×1
CHLORAPREP W/TINT 26 (MISCELLANEOUS) ×1 IMPLANT
DERMABOND ADVANCED .7 DNX12 (GAUZE/BANDAGES/DRESSINGS) ×1 IMPLANT
DRAPE LAPAROTOMY 77X122 PED (DRAPES) ×1 IMPLANT
ELECT CAUTERY BLADE 6.4 (BLADE) ×1 IMPLANT
ELECT REM PT RETURN 9FT ADLT (ELECTROSURGICAL) ×1
ELECTRODE REM PT RTRN 9FT ADLT (ELECTROSURGICAL) ×1 IMPLANT
GAUZE 4X4 16PLY ~~LOC~~+RFID DBL (SPONGE) ×1 IMPLANT
GLOVE ORTHO TXT STRL SZ7.5 (GLOVE) ×1 IMPLANT
GOWN STRL REUS W/ TWL LRG LVL3 (GOWN DISPOSABLE) ×1 IMPLANT
GOWN STRL REUS W/ TWL XL LVL3 (GOWN DISPOSABLE) ×1 IMPLANT
GOWN STRL REUS W/TWL LRG LVL3 (GOWN DISPOSABLE) ×1
GOWN STRL REUS W/TWL XL LVL3 (GOWN DISPOSABLE) ×1
KIT TURNOVER KIT A (KITS) ×1 IMPLANT
MANIFOLD NEPTUNE II (INSTRUMENTS) ×1 IMPLANT
NDL HYPO 22X1.5 SAFETY MO (MISCELLANEOUS) ×1 IMPLANT
NEEDLE HYPO 22X1.5 SAFETY MO (MISCELLANEOUS) ×1 IMPLANT
NS IRRIG 500ML POUR BTL (IV SOLUTION) ×1 IMPLANT
PACK BASIN MINOR ARMC (MISCELLANEOUS) ×1 IMPLANT
SPIKE FLUID TRANSFER (MISCELLANEOUS) ×1 IMPLANT
SUT ETHIBOND 0 MO6 C/R (SUTURE) ×1 IMPLANT
SUT MNCRL 4-0 (SUTURE) ×1
SUT MNCRL 4-0 27XMFL (SUTURE) ×1
SUT VIC AB 3-0 SH 27 (SUTURE) ×1
SUT VIC AB 3-0 SH 27X BRD (SUTURE) ×1 IMPLANT
SUTURE MNCRL 4-0 27XMF (SUTURE) ×1 IMPLANT
SYR 10ML LL (SYRINGE) ×1 IMPLANT
TRAP FLUID SMOKE EVACUATOR (MISCELLANEOUS) ×1 IMPLANT
WATER STERILE IRR 500ML POUR (IV SOLUTION) ×1 IMPLANT

## 2022-11-29 NOTE — Discharge Instructions (Addendum)
Wear abdominal binder, at all times, with exception of showering.   AMBULATORY SURGERY  DISCHARGE INSTRUCTIONS  The drugs that you were given will stay in your system until tomorrow so for the next 24 hours you should not:  Drive an automobile Make any legal decisions Drink any alcoholic beverage  You may resume regular meals tomorrow.  Today it is better to start with liquids and gradually work up to solid foods.  You may eat anything you prefer, but it is better to start with liquids, then soup and crackers, and gradually work up to solid foods.  Please notify your doctor immediately if you have any unusual bleeding, trouble breathing, redness and pain at the surgery site, drainage, fever, or pain not relieved by medication.  Additional Instructions:  Please contact your physician with any problems or Same Day Surgery at [phone], Monday through Friday 6 am to 4 pm, or [place] [facility] number at [phone].

## 2022-11-29 NOTE — Anesthesia Preprocedure Evaluation (Signed)
Anesthesia Evaluation  Patient identified by MRN, date of birth, ID band Patient awake    Reviewed: Allergy & Precautions, H&P , NPO status , Patient's Chart, lab work & pertinent test results, reviewed documented beta blocker date and time   History of Anesthesia Complications Negative for: history of anesthetic complications  Airway Mallampati: I  TM Distance: >3 FB Neck ROM: full    Dental  (+) Dental Advidsory Given, Teeth Intact   Pulmonary neg pulmonary ROS, Continuous Positive Airway Pressure Ventilation    Pulmonary exam normal breath sounds clear to auscultation       Cardiovascular Exercise Tolerance: Good negative cardio ROS Normal cardiovascular exam Rhythm:regular Rate:Normal     Neuro/Psych negative neurological ROS  negative psych ROS   GI/Hepatic negative GI ROS, Neg liver ROS,,,  Endo/Other  negative endocrine ROS    Renal/GU negative Renal ROS  negative genitourinary   Musculoskeletal   Abdominal   Peds  Hematology negative hematology ROS (+)   Anesthesia Other Findings Past Medical History: No date: Basal cell carcinoma (BCC) No date: History of broken nose No date: History of chicken pox No date: Umbilical hernia without obstruction or gangrene   Reproductive/Obstetrics negative OB ROS                             Anesthesia Physical Anesthesia Plan  ASA: 1  Anesthesia Plan: General   Post-op Pain Management:    Induction: Intravenous  PONV Risk Score and Plan: 2 and Ondansetron, Dexamethasone, Midazolam and Treatment may vary due to age or medical condition  Airway Management Planned: Oral ETT and LMA  Additional Equipment:   Intra-op Plan:   Post-operative Plan: Extubation in OR  Informed Consent: I have reviewed the patients History and Physical, chart, labs and discussed the procedure including the risks, benefits and alternatives for the proposed  anesthesia with the patient or authorized representative who has indicated his/her understanding and acceptance.     Dental Advisory Given  Plan Discussed with: Anesthesiologist, CRNA and Surgeon  Anesthesia Plan Comments:        Anesthesia Quick Evaluation

## 2022-11-29 NOTE — Op Note (Signed)
Umbilical Hernia Repair  Pre-operative Diagnosis: Umbilical hernia, less than 3 cm  Post-operative Diagnosis: Umbilical hernia, 2 small adjacent defects together greater than 3 cm.  Surgeon: Campbell Lerner, MD FACS  Anesthesia: General   Findings: 3.2 cm fascial defect diameter    Estimated Blood Loss: 5 mL                 Specimens: sac, incarcerated preperitoneal tissue excised and discarded.         Complications: none              Procedure Details  The patient was seen again in the Holding Room. The benefits, complications, treatment options, and expected outcomes were discussed with the patient. The risks of bleeding, infection, recurrence of symptoms, failure to resolve symptoms, bowel injury, mesh placement, mesh infection, any of which could require further surgery were reviewed with the patient. The likelihood of improving the patient's symptoms with return to their baseline status is good.  The patient and/or family concurred with the proposed plan, giving informed consent.  The patient was taken to Operating Room, identified, and the procedure verified.  A Time Out was held and the above information confirmed.  Prior to the induction of general anesthesia, antibiotic prophylaxis was administered. VTE prophylaxis was in place. General endotracheal anesthesia was then administered and tolerated well. After the induction, the abdomen was prepped with Chloraprep and draped in the sterile fashion. The patient was positioned in the supine position.  Incision was created with a scalpel over the hernia defect. Electrocautery was used to dissect through subcutaneous tissue, the hernia sac was opened excised with the accompanying preperitoneal adipose tissue. I then proceeded with evaluating the preperitoneal dissection, and identified that there was a true umbilical defect inferiorly that did not have any content and it was an additional 1.5 cm.  Therefore the entire hernia defect was  measured after division of a 4 mm fascial band separating the 2 defects.  Total size of the umbilical and supraumbilical defect is 3.2 cm.  I closed the hernia defect with interrupted 0 Ethibond sutures in a reapproximating the defect in the midline, vertically. Total of 50 mL of Exparel with marcaine quarter percent with epinephrine was used to inject the site.  Incision was closed in a 2 layer fashion with 3-0 Vicryl to pexy the umbilical skin and 4-0 Monocryl. Dermabond was used to coat the skin. Patient tolerated procedure well and there were no immediate complications.  We applied an abdominal binder, prior to transferring him to PACU.  Needle and laparotomy counts were correct   Campbell Lerner, M.D., [facility]  11/29/2022 ; 8:40 AM

## 2022-11-29 NOTE — Interval H&P Note (Signed)
History and Physical Interval Note:  11/29/2022 7:23 AM  Frank Gray  has presented today for surgery, with the diagnosis of umbilical hernia initial reducible less 3 cm.  The various methods of treatment have been discussed with the patient and family. After consideration of risks, benefits and other options for treatment, the patient has consented to  Procedure(s): HERNIA REPAIR UMBILICAL ADULT, open (N/A) as a surgical intervention.  The patient's history has been reviewed, patient examined, no change in status, stable for surgery.  I have reviewed the patient's chart and labs.  Questions were answered to the patient's satisfaction.     Campbell Lerner

## 2022-11-29 NOTE — Transfer of Care (Signed)
Immediate Anesthesia Transfer of Care Note  Patient: Frank Gray  Procedure(s) Performed: HERNIA REPAIR UMBILICAL ADULT, open  Patient Location: PACU  Anesthesia Type:General  Level of Consciousness: drowsy and patient cooperative  Airway & Oxygen Therapy: Patient Spontanous Breathing and Patient connected to face mask oxygen  Post-op Assessment: Report given to RN and Post -op Vital signs reviewed and stable  Post vital signs: Reviewed and stable  Last Vitals:  Vitals Value Taken Time  BP 132/89 11/29/22 0836  Temp 36.9 C 11/29/22 0836  Pulse 97 11/29/22 0841  Resp 19 11/29/22 0841  SpO2 98 % 11/29/22 0841  Vitals shown include unfiled device data.  Last Pain:  Vitals:   11/29/22 0836  TempSrc:   PainSc: 0-No pain         Complications: No notable events documented.

## 2022-11-29 NOTE — Anesthesia Procedure Notes (Signed)
Procedure Name: LMA Insertion Date/Time: 11/29/2022 7:37 AM  Performed by: Maryla Morrow., CRNAPre-anesthesia Checklist: Patient identified, Patient being monitored, Timeout performed, Emergency Drugs available and Suction available Patient Re-evaluated:Patient Re-evaluated prior to induction Oxygen Delivery Method: Circle system utilized Preoxygenation: Pre-oxygenation with 100% oxygen Induction Type: IV induction Ventilation: Mask ventilation without difficulty LMA: LMA inserted LMA Size: 4.0 Tube type: Oral Number of attempts: 1 Placement Confirmation: positive ETCO2 and breath sounds checked- equal and bilateral Tube secured with: Tape Dental Injury: Teeth and Oropharynx as per pre-operative assessment

## 2022-12-01 NOTE — Anesthesia Postprocedure Evaluation (Signed)
Anesthesia Post Note  Patient: Frank Gray  Procedure(s) Performed: HERNIA REPAIR UMBILICAL ADULT, open  Patient location during evaluation: PACU Anesthesia Type: General Level of consciousness: awake and alert Pain management: pain level controlled Vital Signs Assessment: post-procedure vital signs reviewed and stable Respiratory status: spontaneous breathing, nonlabored ventilation, respiratory function stable and patient connected to nasal cannula oxygen Cardiovascular status: blood pressure returned to baseline and stable Postop Assessment: no apparent nausea or vomiting Anesthetic complications: no   No notable events documented.   Last Vitals:  Vitals:   11/29/22 0845 11/29/22 0905  BP: (!) 131/90 125/80  Pulse: 100 95  Resp: 15 16  Temp: 36.7 C 36.6 C  SpO2: 93% 97%    Last Pain:  Vitals:   11/29/22 0905  TempSrc: Temporal  PainSc: 2                  Lenard Simmer

## 2022-12-14 ENCOUNTER — Ambulatory Visit (INDEPENDENT_AMBULATORY_CARE_PROVIDER_SITE_OTHER): Payer: 59 | Admitting: Physician Assistant

## 2022-12-14 ENCOUNTER — Encounter: Payer: Self-pay | Admitting: Physician Assistant

## 2022-12-14 VITALS — BP 123/83 | HR 72 | Temp 98.3°F | Ht 71.0 in | Wt 206.6 lb

## 2022-12-14 DIAGNOSIS — Z09 Encounter for follow-up examination after completed treatment for conditions other than malignant neoplasm: Secondary | ICD-10-CM | POA: Diagnosis not present

## 2022-12-14 DIAGNOSIS — K429 Umbilical hernia without obstruction or gangrene: Secondary | ICD-10-CM

## 2022-12-14 NOTE — Progress Notes (Signed)
[facility] POST-OP OFFICE VISIT  12/14/2022  HPI: Frank Gray is a 47 y.o. male 15 days s/p umbilical hernia repair (<3 cm) with Dr Claudine Mouton   He is doing well Had soreness and pain for about the first week.  This is now resolved No fever, chills, nausea, emesis, or bowel changes He has utilized the abdominal binder daily No issues with incision No other complaints   Vital signs: BP 123/83   Pulse 72   Temp 98.3 F (36.8 C) (Oral)   Ht 5\' 11"  (1.803 m)   Wt 206 lb 9.6 oz (93.7 kg)   SpO2 99%   BMI 28.81 kg/m    Physical Exam: Constitutional: Well appearing male, NAD Abdomen: Soft, non-tender, non-distended, no rebound/guarding Skin: Umbilical incision is healing well, no erythema or drainage   Assessment/Plan: This is a 47 y.o. male 15 days s/p umbilical hernia repair (<3 cm) with Dr Claudine Mouton    - Pain control prn  - Okay to wean from abdominal binder use  - Reviewed wound care recommendation  - Reviewed lifting restrictions; 6 weeks total  - He can follow up on as needed basis; He understands to call with questions/concerns  -- Lynden Oxford, PA-C [facility] 12/14/2022, 2:07 PM M-F: 7am - 4pm

## 2022-12-14 NOTE — Patient Instructions (Signed)

## 2023-11-30 ENCOUNTER — Ambulatory Visit (INDEPENDENT_AMBULATORY_CARE_PROVIDER_SITE_OTHER): Admitting: Family Medicine

## 2023-11-30 ENCOUNTER — Encounter: Payer: Self-pay | Admitting: Family Medicine

## 2023-11-30 VITALS — BP 128/78 | HR 73 | Temp 97.7°F | Ht 71.0 in | Wt 208.6 lb

## 2023-11-30 DIAGNOSIS — Z1211 Encounter for screening for malignant neoplasm of colon: Secondary | ICD-10-CM

## 2023-11-30 DIAGNOSIS — Z0000 Encounter for general adult medical examination without abnormal findings: Secondary | ICD-10-CM | POA: Diagnosis not present

## 2023-11-30 NOTE — Progress Notes (Signed)
 Complete physical exam   Patient: Frank Gray   DOB: Oct 16, 1975   48 y.o. Male  MRN: [id number] Visit Date: 11/30/2023  Today's healthcare provider: Nancyann Perry, MD   Chief Complaint  Patient presents with   Annual Exam    Patient presents for Annual physical exam.  Diet: Patient states he eats fairly clean, otherwise normal.  Exercise: Yes, 5-6 days week for 1-1 hour 15 min Sleep: Well  Overall Feeling: Well  Screenings: Colonoscopy, patient agrees if recommended    Subjective    Discussed the use of AI scribe software for clinical note transcription with the patient, who gave verbal consent to proceed.  History of Present Illness   Frank Gray is a 48 year old male who presents for an annual physical exam.  He reports no current health problems and has not had a physical exam in several years. No breathing issues, chest pain, hearing problems, tinnitus, or gastrointestinal symptoms.  He maintains an active lifestyle, exercising five to six days a week for an hour to an hour and a half, focusing on cardio, weights, and stretching.  He has a family history of colon polyps, as his father has had some, but there is no history of colon cancer in the family.  He is not currently taking any medications and has never been a regular smoker.         Past Medical History:  Diagnosis Date   Basal cell carcinoma (BCC)    History of broken nose    History of chicken pox    Umbilical hernia without obstruction or gangrene    Past Surgical History:  Procedure Laterality Date   BASAL CELL CARCINOMA EXCISION     HERNIA REPAIR     NOSE SURGERY     broken nose as a child   UMBILICAL HERNIA REPAIR N/A 11/29/2022   Procedure: HERNIA REPAIR UMBILICAL ADULT, open;  Surgeon: Lane Shope, MD;  Location: [facility] ORS;  Service: General;  Laterality: N/A;   Social History   Socioeconomic History   Marital status: Married    Spouse name: Not on file   Number of  children: 4   Years of education: Not on file   Highest education level: Not on file  Occupational History   Not on file  Tobacco Use   Smoking status: Never   Smokeless tobacco: Never  Vaping Use   Vaping status: Never Used  Substance and Sexual Activity   Alcohol use: Yes    Alcohol/week: 5.0 - 6.0 standard drinks of alcohol    Types: 5 - 6 Cans of beer per week    Comment: occ   Drug use: No   Sexual activity: Yes  Other Topics Concern   Not on file  Social History Narrative   Not on file   Social Drivers of Health   Financial Resource Strain: Not on file  Food Insecurity: Not on file  Transportation Needs: Not on file  Physical Activity: Not on file  Stress: Not on file  Social Connections: Not on file  Intimate Partner Violence: Not on file   Family Status  Relation Name Status   Mother  Alive   Father  Alive   Brother  Alive   Neg Hx  (Not Specified)  No partnership data on file   Family History  Problem Relation Age of Onset   Dementia Mother    Cancer Neg Hx    No Known Allergies  Patient  Care Team: Gasper Nancyann BRAVO, MD as PCP - General (Family Medicine) Gregorio Adine MATSU, MD as Referring Physician (Dermatology)   Medications: No outpatient medications prior to visit.   No facility-administered medications prior to visit.    Review of Systems  Constitutional:  Negative for appetite change, chills and fever.  Respiratory:  Negative for chest tightness, shortness of breath and wheezing.   Cardiovascular:  Negative for chest pain and palpitations.  Gastrointestinal:  Negative for abdominal pain, nausea and vomiting.      Objective    BP 128/78 (BP Location: Left Arm, Patient Position: Sitting, Cuff Size: Normal)   Pulse 73   Temp 97.7 F (36.5 C) (Oral)   Ht 5' 11 (1.803 m)   Wt 208 lb 9.6 oz (94.6 kg)   SpO2 99%   BMI 29.09 kg/m    Physical Exam  General Appearance:    Well developed, well nourished male. Alert, cooperative, in no  acute distress, appears stated age  Head:    Normocephalic, without obvious abnormality, atraumatic  Eyes:    PERRL, conjunctiva/corneas clear, EOM's intact, fundi    benign, both eyes       Ears:    Normal TM's and external ear canals, both ears  Nose:   Nares normal, septum midline, mucosa normal, no drainage   or sinus tenderness  Throat:   Lips, mucosa, and tongue normal; teeth and gums normal  Neck:   Supple, symmetrical, trachea midline, no adenopathy;       thyroid:  No enlargement/tenderness/nodules; no carotid   bruit or JVD  Back:     Symmetric, no curvature, ROM normal, no CVA tenderness  Lungs:     Clear to auscultation bilaterally, respirations unlabored  Chest wall:    No tenderness or deformity  Heart:    Normal heart rate. Normal rhythm. No murmurs, rubs, or gallops.  S1 and S2 normal  Abdomen:     Soft, non-tender, bowel sounds active all four quadrants,    no masses, no organomegaly  Genitalia:    deferred  Rectal:    deferred  Extremities:   All extremities are intact. No cyanosis or edema  Pulses:   2+ and symmetric all extremities  Skin:   Skin color, texture, turgor normal, no rashes or lesions  Lymph nodes:   Cervical, supraclavicular, and axillary nodes normal  Neurologic:   CNII-XII intact. Normal strength, sensation and reflexes      throughout       Last depression screening scores    11/30/2023   11:16 AM 11/08/2022   10:45 AM 10/28/2020    2:48 PM  PHQ 2/9 Scores  PHQ - 2 Score 0 0 0  PHQ- 9 Score 0  0   Last fall risk screening    11/30/2023   11:16 AM  Fall Risk   Falls in the past year? 0  Number falls in past yr: 0  Injury with Fall? 0  Risk for fall due to : No Fall Risks  Follow up Falls evaluation completed   Last Audit-C alcohol use screening    05/20/2020    8:57 AM  Alcohol Use Disorder Test (AUDIT)  1. How often do you have a drink containing alcohol? 3  2. How many drinks containing alcohol do you have on a typical day when you  are drinking? 0  3. How often do you have six or more drinks on one occasion? 1  AUDIT-C Score 4  4. How often  during the last year have you found that you were not able to stop drinking once you had started? 0  5. How often during the last year have you failed to do what was normally expected from you because of drinking? 0  6. How often during the last year have you needed a first drink in the morning to get yourself going after a heavy drinking session? 0  7. How often during the last year have you had a feeling of guilt of remorse after drinking? 0  8. How often during the last year have you been unable to remember what happened the night before because you had been drinking? 0  9. Have you or someone else been injured as a result of your drinking? 0  10. Has a relative or friend or a doctor or another health worker been concerned about your drinking or suggested you cut down? 0  Alcohol Use Disorder Identification Test Final Score (AUDIT) 4  Alcohol Brief Interventions/Follow-up AUDIT Score <7 follow-up not indicated      Data saved with a previous flowsheet row definition   A score of 3 or more in women, and 4 or more in men indicates increased risk for alcohol abuse, EXCEPT if all of the points are from question 1   No results found for any visits on 11/30/23.  Assessment & Plan    Routine Health Maintenance and Physical Exam  Exercise Activities and Dietary recommendations  Goals   None     Immunization History  Administered Date(s) Administered   Influenza,inj,Quad PF,6+ Mos 01/04/2017   Moderna Sars-Covid-2 Vaccination 01/25/2020, 02/22/2020   Tdap 04/23/2010, 05/20/2020    Health Maintenance  Topic Date Due   HIV Screening  Never done   Colonoscopy  Never done   INFLUENZA VACCINE  12/16/2023 (Originally 11/04/2023)   COVID-19 Vaccine (3 - Moderna risk series) 12/16/2023 (Originally 03/21/2020)   Hepatitis B Vaccines 19-59 Average Risk (1 of 3 - 19+ 3-dose series)  11/29/2024 (Originally 07/22/1994)   DTaP/Tdap/Td (3 - Td or Tdap) 05/20/2030   Hepatitis C Screening  Completed   Pneumococcal Vaccine  Aged Out   HPV VACCINES  Aged Out   Meningococcal B Vaccine  Aged Out    Discussed health benefits of physical activity, and encouraged him to engage in regular exercise appropriate for his age and condition.    - CBC w/Diff/Platelet - Comprehensive Metabolic Panel (CMET) - Lipid panel  2. Colon cancer screening He does have family history of colon polyps in his father.  - Ambulatory referral to Gastroenterology        Nancyann Perry, MD  [facility] [phone] (phone) [phone] (fax)  [facility]

## 2023-12-02 ENCOUNTER — Ambulatory Visit: Payer: Self-pay | Admitting: Family Medicine

## 2023-12-02 LAB — CBC WITH DIFFERENTIAL/PLATELET
Basophils Absolute: 0 x10E3/uL (ref 0.0–0.2)
Basos: 0 %
EOS (ABSOLUTE): 0.1 x10E3/uL (ref 0.0–0.4)
Eos: 1 %
Hematocrit: 51.5 % — ABNORMAL HIGH (ref 37.5–51.0)
Hemoglobin: 16.9 g/dL (ref 13.0–17.7)
Immature Grans (Abs): 0 x10E3/uL (ref 0.0–0.1)
Immature Granulocytes: 0 %
Lymphocytes Absolute: 3.4 x10E3/uL — ABNORMAL HIGH (ref 0.7–3.1)
Lymphs: 35 %
MCH: 30.3 pg (ref 26.6–33.0)
MCHC: 32.8 g/dL (ref 31.5–35.7)
MCV: 93 fL (ref 79–97)
Monocytes Absolute: 0.8 x10E3/uL (ref 0.1–0.9)
Monocytes: 9 %
Neutrophils Absolute: 5.4 x10E3/uL (ref 1.4–7.0)
Neutrophils: 55 %
Platelets: 204 x10E3/uL (ref 150–450)
RBC: 5.57 x10E6/uL (ref 4.14–5.80)
RDW: 12.2 % (ref 11.6–15.4)
WBC: 9.7 x10E3/uL (ref 3.4–10.8)

## 2023-12-02 LAB — COMPREHENSIVE METABOLIC PANEL WITH GFR
ALT: 17 IU/L (ref 0–44)
AST: 27 IU/L (ref 0–40)
Albumin: 4.7 g/dL (ref 4.1–5.1)
Alkaline Phosphatase: 75 IU/L (ref 44–121)
BUN/Creatinine Ratio: 9 (ref 9–20)
BUN: 13 mg/dL (ref 6–24)
Bilirubin Total: 0.5 mg/dL (ref 0.0–1.2)
CO2: 22 mmol/L (ref 20–29)
Calcium: 9.3 mg/dL (ref 8.7–10.2)
Chloride: 99 mmol/L (ref 96–106)
Creatinine, Ser: 1.37 mg/dL — ABNORMAL HIGH (ref 0.76–1.27)
Globulin, Total: 2.5 g/dL (ref 1.5–4.5)
Glucose: 99 mg/dL (ref 70–99)
Potassium: 4.6 mmol/L (ref 3.5–5.2)
Sodium: 138 mmol/L (ref 134–144)
Total Protein: 7.2 g/dL (ref 6.0–8.5)
eGFR: 64 mL/min/1.73 (ref >59)

## 2023-12-02 LAB — LIPID PANEL
Chol/HDL Ratio: 3.6 ratio (ref 0.0–5.0)
Cholesterol, Total: 166 mg/dL (ref 100–199)
HDL: 46 mg/dL (ref >39)
LDL Chol Calc (NIH): 106 mg/dL — ABNORMAL HIGH (ref 0–99)
Triglycerides: 72 mg/dL (ref 0–149)
VLDL Cholesterol Cal: 14 mg/dL (ref 5–40)

## 2024-01-06 ENCOUNTER — Encounter: Payer: Self-pay | Admitting: Family Medicine

## 2024-01-06 ENCOUNTER — Telehealth: Payer: Self-pay

## 2024-01-06 ENCOUNTER — Other Ambulatory Visit: Payer: Self-pay

## 2024-01-06 DIAGNOSIS — R7989 Other specified abnormal findings of blood chemistry: Secondary | ICD-10-CM

## 2024-01-06 DIAGNOSIS — Z1211 Encounter for screening for malignant neoplasm of colon: Secondary | ICD-10-CM

## 2024-01-06 MED ORDER — GOLYTELY 236 G PO SOLR: 4000.0000 mL | Freq: Once | ORAL | 0 refills | Status: AC

## 2024-01-06 NOTE — Telephone Encounter (Signed)
 Gastroenterology Pre-Procedure Review  Request Date: 03/19/24 Requesting Physician: Dr. Melany  PATIENT REVIEW QUESTIONS: The patient responded to the following health history questions as indicated:    1. Are you having any GI issues? no 2. Do you have a personal history of Polyps? no 3. Do you have a family history of Colon Cancer or Polyps? no 4. Diabetes Mellitus? no 5. Joint replacements in the past 12 months?no 6. Major health problems in the past 3 months?no 7. Any artificial heart valves, MVP, or defibrillator?no    MEDICATIONS & ALLERGIES:    Patient reports the following regarding taking any anticoagulation/antiplatelet therapy:   Plavix, Coumadin, Eliquis, Xarelto, Lovenox, Pradaxa, Brilinta, or Effient? no Aspirin? no  Patient confirms/reports the following medications:  No current outpatient medications on file.   No current facility-administered medications for this visit.    Patient confirms/reports the following allergies:  No Known Allergies  No orders of the defined types were placed in this encounter.   AUTHORIZATION INFORMATION Primary Insurance: 1D#: Group #:  Secondary Insurance: 1D#: Group #:  SCHEDULE INFORMATION: Date: 03/19/24 Time: Location: [facility]

## 2024-01-11 ENCOUNTER — Ambulatory Visit: Payer: Self-pay | Admitting: Family Medicine

## 2024-01-11 LAB — RENAL FUNCTION PANEL
Albumin: 4.5 g/dL (ref 4.1–5.1)
BUN/Creatinine Ratio: 8 — ABNORMAL LOW (ref 9–20)
BUN: 9 mg/dL (ref 6–24)
CO2: 25 mmol/L (ref 20–29)
Calcium: 9.4 mg/dL (ref 8.7–10.2)
Chloride: 99 mmol/L (ref 96–106)
Creatinine, Ser: 1.14 mg/dL (ref 0.76–1.27)
Glucose: 111 mg/dL — ABNORMAL HIGH (ref 70–99)
Phosphorus: 2.6 mg/dL — ABNORMAL LOW (ref 2.8–4.1)
Potassium: 4.1 mmol/L (ref 3.5–5.2)
Sodium: 140 mmol/L (ref 134–144)
eGFR: 79 mL/min/1.73 (ref >59)

## 2024-03-14 ENCOUNTER — Encounter: Payer: Self-pay | Admitting: Gastroenterology

## 2024-03-16 NOTE — H&P (Signed)
 Clotilda Schaffer, MD  [address] [place], [place] [address] Phone: [phone] Fax : [phone]  Primary Care Physician:  Gasper Nancyann BRAVO, MD Primary Gastroenterologist:  Dr. Schaffer  Pre-Procedure History & Physical: HPI:  Frank Gray is a 48 y.o. male is here for a screening colonoscopy.  Prior colonoscopy? Fhx CRC? Blood thinners?  Past Medical History:  Diagnosis Date   Basal cell carcinoma (BCC)    History of broken nose    History of chicken pox    Umbilical hernia without obstruction or gangrene     Past Surgical History:  Procedure Laterality Date   BASAL CELL CARCINOMA EXCISION     HERNIA REPAIR     NOSE SURGERY     broken nose as a child   UMBILICAL HERNIA REPAIR N/A 11/29/2022   Procedure: HERNIA REPAIR UMBILICAL ADULT, open;  Surgeon: Lane Shope, MD;  Location: [facility] ORS;  Service: General;  Laterality: N/A;    Prior to Admission medications  Not on File    Allergies as of 01/06/2024   (No Known Allergies)    Family History  Problem Relation Age of Onset   Dementia Mother    Cancer Neg Hx     Social History   Socioeconomic History   Marital status: Married    Spouse name: Not on file   Number of children: 4   Years of education: Not on file   Highest education level: Not on file  Occupational History   Not on file  Tobacco Use   Smoking status: Never   Smokeless tobacco: Never  Vaping Use   Vaping status: Never Used  Substance and Sexual Activity   Alcohol use: Yes    Alcohol/week: 5.0 - 6.0 standard drinks of alcohol    Types: 5 - 6 Cans of beer per week    Comment: occ   Drug use: No   Sexual activity: Yes  Other Topics Concern   Not on file  Social History Narrative   Not on file   Social Drivers of Health   Tobacco Use: Low Risk (03/14/2024)   Patient History    Smoking Tobacco Use: Never    Smokeless Tobacco Use: Never    Passive Exposure: Not on file  Financial Resource Strain: Not on file   Food Insecurity: Not on file  Transportation Needs: Not on file  Physical Activity: Not on file  Stress: Not on file  Social Connections: Not on file  Intimate Partner Violence: Not on file  Depression (PHQ2-9): Low Risk (11/30/2023)   Depression (PHQ2-9)    PHQ-2 Score: 0  Alcohol Screen: Not on file  Housing: Not on file  Utilities: Not on file  Health Literacy: Not on file    Review of Systems: See HPI, otherwise negative ROS  Physical Exam: Ht 5' 11 (1.803 m)   Wt 90.7 kg   BMI 27.89 kg/m  CONSTITUTIONAL: Well-appearing in no acute distress.  HEENT: Pupils equal, round, Extraocular movements intact. Conjunctivae clear NECK: Neck supple CARDIOVASCULAR: Regular rate, no LE edema  RESPIRATORY: No labored breathing  ABDOMEN: Abdomen soft, nontender, not distended, no guarding, no rigidity SKIN: No apparent skin rashes or lesions. NEUROLOGIC: Normal speech, no focal findings. Mental status alert and oriented x4. PSYCHIATRIC: Mood and affect normal.   Impression/Plan: Frank Gray is now here to undergo a screening colonoscopy.  Risks, benefits, and alternatives regarding colonoscopy have been reviewed with the patient.  Questions have been answered.  All parties  agreeable.

## 2024-03-19 ENCOUNTER — Ambulatory Visit: Admitting: Anesthesiology

## 2024-03-19 ENCOUNTER — Ambulatory Visit
Admission: RE | Admit: 2024-03-19 | Discharge: 2024-03-19 | Disposition: A | Payer: Self-pay | Attending: Gastroenterology | Admitting: Gastroenterology

## 2024-03-19 ENCOUNTER — Encounter: Admission: RE | Disposition: A | Payer: Self-pay | Source: Home / Self Care | Attending: Gastroenterology

## 2024-03-19 ENCOUNTER — Other Ambulatory Visit: Payer: Self-pay

## 2024-03-19 ENCOUNTER — Encounter: Payer: Self-pay | Admitting: Gastroenterology

## 2024-03-19 DIAGNOSIS — K640 First degree hemorrhoids: Secondary | ICD-10-CM | POA: Diagnosis not present

## 2024-03-19 DIAGNOSIS — D124 Benign neoplasm of descending colon: Secondary | ICD-10-CM | POA: Insufficient documentation

## 2024-03-19 DIAGNOSIS — K635 Polyp of colon: Secondary | ICD-10-CM

## 2024-03-19 DIAGNOSIS — Z1211 Encounter for screening for malignant neoplasm of colon: Secondary | ICD-10-CM | POA: Diagnosis present

## 2024-03-19 DIAGNOSIS — K5730 Diverticulosis of large intestine without perforation or abscess without bleeding: Secondary | ICD-10-CM | POA: Insufficient documentation

## 2024-03-19 HISTORY — PX: COLONOSCOPY: SHX5424

## 2024-03-19 SURGERY — COLONOSCOPY
Anesthesia: General

## 2024-03-19 MED ORDER — LIDOCAINE HCL (CARDIAC) PF 100 MG/5ML IV SOSY
PREFILLED_SYRINGE | INTRAVENOUS | Status: DC | PRN
  Administered 2024-03-19: 10:00:00 50 mg via INTRAVENOUS

## 2024-03-19 MED ORDER — SODIUM CHLORIDE 0.9 % IV SOLN: INTRAVENOUS | Status: DC

## 2024-03-19 MED ORDER — PROPOFOL 10 MG/ML IV BOLUS
INTRAVENOUS | Status: DC | PRN
  Administered 2024-03-19: 10:00:00 80 mg via INTRAVENOUS
  Administered 2024-03-19: 10:00:00 170 mg via INTRAVENOUS
  Administered 2024-03-19: 10:00:00 80 mg via INTRAVENOUS

## 2024-03-19 MED ORDER — PROPOFOL 10 MG/ML IV BOLUS
INTRAVENOUS | Status: AC
  Filled 2024-03-19: qty 40

## 2024-03-19 MED ORDER — LACTATED RINGERS IV SOLN: INTRAVENOUS | Status: DC

## 2024-03-19 SURGICAL SUPPLY — 18 items
CLIP HMST 235XBRD CATH ROT (MISCELLANEOUS) IMPLANT
ELECTRODE REM PT RTRN 9FT ADLT (ELECTROSURGICAL) IMPLANT
FORCEPS BIOP RAD 4 LRG CAP 4 (CUTTING FORCEPS) IMPLANT
GOWN CVR UNV OPN BCK APRN NK (MISCELLANEOUS) ×2 IMPLANT
INJECTOR VARIJECT VIN23 (MISCELLANEOUS) IMPLANT
KIT DEFENDO VALVE AND CONN (KITS) IMPLANT
KIT PROCEDURE OLYMPUS (MISCELLANEOUS) ×1 IMPLANT
MANIFOLD NEPTUNE II (INSTRUMENTS) ×1 IMPLANT
MARKER SPOT ENDO TATTOO 5ML (MISCELLANEOUS) IMPLANT
PROBE APC STR FIRE (PROBE) IMPLANT
RETRIEVER NET ROTH 2.5X230 LF (MISCELLANEOUS) IMPLANT
SNARE COLD EXACTO (MISCELLANEOUS) IMPLANT
SNARE SHORT THROW 13M SML OVAL (MISCELLANEOUS) IMPLANT
SNARE SNG USE RND 15MM (INSTRUMENTS) IMPLANT
STRAP BODY AND KNEE 60X3 (MISCELLANEOUS) ×1 IMPLANT
SYR 50ML SLIP (SYRINGE) IMPLANT
TRAP ETRAP POLY (MISCELLANEOUS) IMPLANT
WATER STERILE IRR 250ML POUR (IV SOLUTION) ×1 IMPLANT

## 2024-03-19 NOTE — Anesthesia Postprocedure Evaluation (Signed)
 Anesthesia Post Note  Patient: Frank Gray  Procedure(s) Performed: COLONOSCOPY with polypectomy  Patient location during evaluation: PACU Anesthesia Type: General Level of consciousness: awake and alert Pain management: pain level controlled Vital Signs Assessment: post-procedure vital signs reviewed and stable Respiratory status: spontaneous breathing, nonlabored ventilation, respiratory function stable and patient connected to nasal cannula oxygen Cardiovascular status: blood pressure returned to baseline and stable Postop Assessment: no apparent nausea or vomiting Anesthetic complications: no   No notable events documented.   Last Vitals:  Vitals:   03/19/24 0945 03/19/24 0955  BP: 112/70 118/80  Pulse: 73 91  Resp: 12 16  Temp: 36.7 C 36.7 C  SpO2: 94% 100%    Last Pain:  Vitals:   03/19/24 0955  PainSc: 0-No pain                 Bethanie Bloxom C Rosmery Duggin

## 2024-03-19 NOTE — Anesthesia Preprocedure Evaluation (Signed)
 Anesthesia Evaluation  Patient identified by MRN, date of birth, ID band Patient awake    Reviewed: Allergy & Precautions, H&P , NPO status , Patient's Chart, lab work & pertinent test results  Airway Mallampati: I  TM Distance: >3 FB Neck ROM: Full    Dental no notable dental hx.    Pulmonary neg pulmonary ROS   Pulmonary exam normal breath sounds clear to auscultation       Cardiovascular negative cardio ROS Normal cardiovascular exam Rhythm:Regular Rate:Normal     Neuro/Psych negative neurological ROS  negative psych ROS   GI/Hepatic negative GI ROS, Neg liver ROS,,,  Endo/Other  negative endocrine ROS    Renal/GU negative Renal ROS  negative genitourinary   Musculoskeletal negative musculoskeletal ROS (+)    Abdominal   Peds negative pediatric ROS (+)  Hematology negative hematology ROS (+)   Anesthesia Other Findings Umbilical hernia without obstruction or gangrene History of broken nose  Basal cell carcinoma     Reproductive/Obstetrics negative OB ROS                              Anesthesia Physical Anesthesia Plan  ASA: 1  Anesthesia Plan: General   Post-op Pain Management:    Induction: Intravenous  PONV Risk Score and Plan:   Airway Management Planned: Simple Face Mask and Natural Airway  Additional Equipment:   Intra-op Plan:   Post-operative Plan: Extubation in OR  Informed Consent: I have reviewed the patients History and Physical, chart, labs and discussed the procedure including the risks, benefits and alternatives for the proposed anesthesia with the patient or authorized representative who has indicated his/her understanding and acceptance.     Dental Advisory Given  Plan Discussed with: Anesthesiologist, CRNA and Surgeon  Anesthesia Plan Comments: (Patient consented for risks of anesthesia including but not limited to:  - adverse reactions to  medications - damage to eyes, teeth, lips or other oral mucosa - nerve damage due to positioning  - sore throat or hoarseness - Damage to heart, brain, nerves, lungs, other parts of body or loss of life  Patient voiced understanding and assent.)         Anesthesia Quick Evaluation

## 2024-03-19 NOTE — Op Note (Signed)
 [facility] Patient Name: Frank Gray Procedure Date: 03/19/2024 9:20 AM MRN: [id number] Account #: [id number] Date of Birth: Mar 01, 1976 Admit Type: Outpatient Age: 48 Room: [facility] OR ROOM 01 Gender: Male Note Status: Finalized Instrument Name: Colonoscope [id number] Procedure:             Colonoscopy Indications:           Screening for colorectal malignant neoplasm Providers:             Clotilda Schaffer, MD Referring MD:          Nancyann BRAVO. Gasper, MD (Referring MD) Medicines:             Propofol  per Anesthesia Complications:         No immediate complications. Procedure:             Pre-Anesthesia Assessment:                        - Prior to the procedure, a History and Physical was                         performed, and patient medications and allergies were                         reviewed. The patient's tolerance of previous                         anesthesia was also reviewed. The risks and benefits                         of the procedure and the sedation options and risks                         were discussed with the patient. All questions were                         answered, and informed consent was obtained. Prior                         Anticoagulants: The patient has taken no anticoagulant                         or antiplatelet agents. ASA Grade Assessment: II - A                         patient with mild systemic disease. After reviewing                         the risks and benefits, the patient was deemed in                         satisfactory condition to undergo the procedure.                        After obtaining informed consent, the colonoscope was                         passed under direct vision. Throughout the procedure,  the patient's blood pressure, pulse, and oxygen                         saturations were monitored continuously. The                         Colonoscope was introduced  through the anus and                         advanced to the the cecum, identified by appendiceal                         orifice and ileocecal valve. The colonoscopy was                         performed without difficulty. The patient tolerated                         the procedure well. The quality of the bowel                         preparation was good. The terminal ileum, ileocecal                         valve, appendiceal orifice, and rectum were                         photographed. The ileocecal valve, appendiceal                         orifice, and rectum were photographed. Findings:      A 5 mm polyp was found in the descending colon. The polyp was sessile.       The polyp was removed with a cold snare. Resection and retrieval were       complete. Estimated blood loss: none.      A few medium-mouthed diverticula were found in the sigmoid colon.      Internal hemorrhoids were found. The hemorrhoids were Grade I (internal       hemorrhoids that do not prolapse).      The terminal ileum appeared normal. Impression:            - One 5 mm polyp in the descending colon, removed with                         a cold snare. Resected and retrieved.                        - Diverticulosis in the sigmoid colon.                        - Internal hemorrhoids.                        - The examined portion of the ileum was normal. Recommendation:        - Patient has a contact number available for                         emergencies. The signs and symptoms of potential  delayed complications were discussed with the patient.                         Return to normal activities tomorrow. Written                         discharge instructions were provided to the patient.                        - High fiber diet.                        - Continue present medications.                        - Await pathology results.                        - Repeat colonoscopy in 5-10  years for surveillance                         based on pathology results.                        - The findings and recommendations were discussed with                         the designated responsible adult. Procedure Code(s):     --- Professional ---                        [phone], Colonoscopy, flexible; with removal of                         tumor(s), polyp(s), or other lesion(s) by snare                         technique Diagnosis Code(s):     --- Professional ---                        Z12.11, Encounter for screening for malignant neoplasm                         of colon                        D12.4, Benign neoplasm of descending colon                        K64.0, First degree hemorrhoids                        K57.30, Diverticulosis of large intestine without                         perforation or abscess without bleeding CPT copyright 2022 [employer]. All rights reserved. The codes documented in this report are preliminary and upon coder review may  be revised to meet current compliance requirements. Clotilda Schaffer, MD 03/19/2024 9:46:58 AM Number of Addenda: 0 Note Initiated On: 03/19/2024 9:20 AM Scope Withdrawal Time: 0 hours 9 minutes 56 seconds  Total Procedure Duration:  0 hours 12 minutes 37 seconds  Estimated Blood Loss:  Estimated blood loss: none.      [facility]

## 2024-03-19 NOTE — Transfer of Care (Signed)
 Immediate Anesthesia Transfer of Care Note  Patient: Frank Gray  Procedure(s) Performed: COLONOSCOPY with polypectomy  Patient Location: PACU  Anesthesia Type: General  Level of Consciousness: awake, alert  and patient cooperative  Airway and Oxygen Therapy: Patient Spontanous Breathing and Patient connected to supplemental oxygen  Post-op Assessment: Post-op Vital signs reviewed, Patient's Cardiovascular Status Stable, Respiratory Function Stable, Patent Airway and No signs of Nausea or vomiting  Post-op Vital Signs: Reviewed and stable  Complications: No notable events documented.

## 2024-03-21 LAB — SURGICAL PATHOLOGY

## 2024-03-26 ENCOUNTER — Ambulatory Visit: Payer: Self-pay | Admitting: Gastroenterology
